# Patient Record
Sex: Female | Born: 1945
Health system: Southern US, Community
[De-identification: ages and names within clinical notes are randomized; demographics above are authoritative.]

## PROBLEM LIST (undated history)

## (undated) DIAGNOSIS — D509 Iron deficiency anemia, unspecified: Secondary | ICD-10-CM

## (undated) DIAGNOSIS — D649 Anemia, unspecified: Secondary | ICD-10-CM

## (undated) DIAGNOSIS — R002 Palpitations: Secondary | ICD-10-CM

## (undated) DIAGNOSIS — E119 Type 2 diabetes mellitus without complications: Secondary | ICD-10-CM

## (undated) DIAGNOSIS — E785 Hyperlipidemia, unspecified: Secondary | ICD-10-CM

## (undated) DIAGNOSIS — I6930 Unspecified sequelae of cerebral infarction: Secondary | ICD-10-CM

## (undated) DIAGNOSIS — J309 Allergic rhinitis, unspecified: Secondary | ICD-10-CM

## (undated) DIAGNOSIS — I639 Cerebral infarction, unspecified: Secondary | ICD-10-CM

## (undated) DIAGNOSIS — E7800 Pure hypercholesterolemia, unspecified: Secondary | ICD-10-CM

## (undated) DIAGNOSIS — I10 Essential (primary) hypertension: Secondary | ICD-10-CM

## (undated) DIAGNOSIS — K219 Gastro-esophageal reflux disease without esophagitis: Secondary | ICD-10-CM

## (undated) DIAGNOSIS — E1151 Type 2 diabetes mellitus with diabetic peripheral angiopathy without gangrene: Secondary | ICD-10-CM

## (undated) HISTORY — DX: Essential (primary) hypertension: I10

## (undated) HISTORY — DX: Gastro-esophageal reflux disease without esophagitis: K21.9

## (undated) HISTORY — DX: Type 2 diabetes mellitus with diabetic peripheral angiopathy without gangrene: E11.51

## (undated) HISTORY — DX: Anemia, unspecified: D64.9

## (undated) HISTORY — PX: ABDOMINAL HYSTERECTOMY: SHX81

## (undated) HISTORY — DX: Unspecified sequelae of cerebral infarction: I69.30

## (undated) HISTORY — DX: Iron deficiency anemia, unspecified: D50.9

## (undated) HISTORY — DX: Cerebral infarction, unspecified: I63.9

## (undated) HISTORY — DX: Pure hypercholesterolemia, unspecified: E78.00

## (undated) HISTORY — DX: Allergic rhinitis, unspecified: J30.9

---
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
## 1998-11-26 ENCOUNTER — Other Ambulatory Visit: Admission: RE | Admit: 1998-11-26 | Discharge: 1998-11-26 | Payer: Self-pay | Admitting: Gynecology

## 1999-07-17 ENCOUNTER — Ambulatory Visit (HOSPITAL_COMMUNITY): Admission: RE | Admit: 1999-07-17 | Discharge: 1999-07-17 | Payer: Self-pay | Admitting: Endocrinology

## 1999-07-17 ENCOUNTER — Encounter: Payer: Self-pay | Admitting: Endocrinology

## 1999-07-22 ENCOUNTER — Encounter: Admission: RE | Admit: 1999-07-22 | Discharge: 1999-10-20 | Payer: Self-pay | Admitting: Internal Medicine

## 1999-08-26 ENCOUNTER — Encounter: Payer: Self-pay | Admitting: *Deleted

## 1999-08-26 ENCOUNTER — Ambulatory Visit (HOSPITAL_COMMUNITY): Admission: RE | Admit: 1999-08-26 | Discharge: 1999-08-26 | Payer: Self-pay | Admitting: *Deleted

## 1999-11-12 ENCOUNTER — Ambulatory Visit (HOSPITAL_COMMUNITY): Admission: RE | Admit: 1999-11-12 | Discharge: 1999-11-12 | Payer: Self-pay | Admitting: Endocrinology

## 1999-11-12 ENCOUNTER — Encounter: Payer: Self-pay | Admitting: Endocrinology

## 2000-03-29 ENCOUNTER — Other Ambulatory Visit: Admission: RE | Admit: 2000-03-29 | Discharge: 2000-03-29 | Payer: Self-pay | Admitting: Gynecology

## 2000-11-09 ENCOUNTER — Encounter: Payer: Self-pay | Admitting: Endocrinology

## 2000-11-09 ENCOUNTER — Ambulatory Visit (HOSPITAL_COMMUNITY): Admission: RE | Admit: 2000-11-09 | Discharge: 2000-11-09 | Payer: Self-pay | Admitting: Endocrinology

## 2001-02-15 ENCOUNTER — Encounter: Payer: Self-pay | Admitting: Endocrinology

## 2001-02-15 ENCOUNTER — Encounter: Admission: RE | Admit: 2001-02-15 | Discharge: 2001-02-15 | Payer: Self-pay | Admitting: Endocrinology

## 2001-06-21 ENCOUNTER — Other Ambulatory Visit: Admission: RE | Admit: 2001-06-21 | Discharge: 2001-06-21 | Payer: Self-pay | Admitting: Gynecology

## 2002-06-28 ENCOUNTER — Other Ambulatory Visit: Admission: RE | Admit: 2002-06-28 | Discharge: 2002-06-28 | Payer: Self-pay | Admitting: Gynecology

## 2007-07-05 ENCOUNTER — Emergency Department (HOSPITAL_COMMUNITY): Admission: EM | Admit: 2007-07-05 | Discharge: 2007-07-05 | Payer: Self-pay | Admitting: Emergency Medicine

## 2008-07-24 ENCOUNTER — Inpatient Hospital Stay (HOSPITAL_COMMUNITY): Admission: EM | Admit: 2008-07-24 | Discharge: 2008-07-26 | Payer: Self-pay | Admitting: Emergency Medicine

## 2008-07-25 ENCOUNTER — Encounter (INDEPENDENT_AMBULATORY_CARE_PROVIDER_SITE_OTHER): Payer: Self-pay | Admitting: Internal Medicine

## 2009-01-01 ENCOUNTER — Other Ambulatory Visit: Admission: RE | Admit: 2009-01-01 | Discharge: 2009-01-01 | Payer: Self-pay | Admitting: Gynecology

## 2009-01-01 ENCOUNTER — Encounter: Payer: Self-pay | Admitting: Gynecology

## 2009-01-01 ENCOUNTER — Ambulatory Visit: Payer: Self-pay | Admitting: Gynecology

## 2010-04-04 ENCOUNTER — Encounter: Payer: Self-pay | Admitting: Endocrinology

## 2010-06-23 LAB — GLUCOSE, CAPILLARY
Glucose-Capillary: 109 mg/dL — ABNORMAL HIGH (ref 70–99)
Glucose-Capillary: 133 mg/dL — ABNORMAL HIGH (ref 70–99)
Glucose-Capillary: 139 mg/dL — ABNORMAL HIGH (ref 70–99)
Glucose-Capillary: 150 mg/dL — ABNORMAL HIGH (ref 70–99)
Glucose-Capillary: 98 mg/dL (ref 70–99)

## 2010-06-23 LAB — URINE MICROSCOPIC-ADD ON

## 2010-06-23 LAB — COMPREHENSIVE METABOLIC PANEL
ALT: 14 U/L (ref 0–35)
AST: 14 U/L (ref 0–37)
Alkaline Phosphatase: 70 U/L (ref 39–117)
CO2: 32 mEq/L (ref 19–32)
Calcium: 9.8 mg/dL (ref 8.4–10.5)
Chloride: 100 mEq/L (ref 96–112)
GFR calc Af Amer: >60 mL/min (ref >60)
GFR calc non Af Amer: >60 mL/min (ref >60)
Glucose, Bld: 129 mg/dL — ABNORMAL HIGH (ref 70–99)
Sodium: 137 mEq/L (ref 135–145)
Total Bilirubin: 0.4 mg/dL (ref 0.3–1.2)

## 2010-06-23 LAB — POCT CARDIAC MARKERS
CKMB, poc: 1.1 ng/mL (ref 1.0–8.0)
Myoglobin, poc: 98.9 ng/mL (ref 12–200)
Troponin i, poc: <0.05 ng/mL (ref 0.00–0.09)

## 2010-06-23 LAB — CBC
Hemoglobin: 11 g/dL — ABNORMAL LOW (ref 12.0–15.0)
Hemoglobin: 11.7 g/dL — ABNORMAL LOW (ref 12.0–15.0)
MCHC: 34 g/dL (ref 30.0–36.0)
MCHC: 35.1 g/dL (ref 30.0–36.0)
RBC: 4.04 MIL/uL (ref 3.87–5.11)
RBC: 4.16 MIL/uL (ref 3.87–5.11)
WBC: 7.2 10*3/uL (ref 4.0–10.5)
WBC: 7.8 10*3/uL (ref 4.0–10.5)

## 2010-06-23 LAB — CARDIAC PANEL(CRET KIN+CKTOT+MB+TROPI)
CK, MB: 0.8 ng/mL (ref 0.3–4.0)
Relative Index: INVALID (ref 0.0–2.5)
Relative Index: INVALID (ref 0.0–2.5)
Total CK: 42 U/L (ref 7–177)
Total CK: 65 U/L (ref 7–177)
Total CK: 83 U/L (ref 7–177)
Troponin I: 0.01 ng/mL (ref 0.00–0.06)

## 2010-06-23 LAB — BASIC METABOLIC PANEL
CO2: 32 mEq/L (ref 19–32)
Calcium: 9.8 mg/dL (ref 8.4–10.5)
GFR calc Af Amer: >60 mL/min (ref >60)
GFR calc non Af Amer: >60 mL/min (ref >60)
Sodium: 136 mEq/L (ref 135–145)

## 2010-06-23 LAB — URINALYSIS, ROUTINE W REFLEX MICROSCOPIC
Ketones, ur: NEGATIVE mg/dL
Leukocytes, UA: NEGATIVE
Protein, ur: NEGATIVE mg/dL
Urobilinogen, UA: 0.2 mg/dL (ref 0.0–1.0)

## 2010-07-28 NOTE — Discharge Summary (Signed)
NAME:  Carrie [name], Carrie [name]              ACCOUNT NO.:  [id number]   MEDICAL RECORD NO.:  [id number]          PATIENT TYPE:  INP   LOCATION:  1432                         FACILITY:  [facility]   PHYSICIAN:  Hind I Elsaid, MD      DATE OF BIRTH:  Nov 16, 1945   DATE OF ADMISSION:  07/24/2008  DATE OF DISCHARGE:  07/26/2008                               DISCHARGE SUMMARY   DISCHARGE DIAGNOSES:  1. Hypertensive urgency.  2. Noncompliance.  3. Diabetes mellitus type 2.  4. Hyperlipidemia.   DISCHARGE MEDICATIONS:  1. Diovan 160 mg daily.  2. Metformin 1000 mg p.o. b.i.d.  3. Amaril 1 mg p.o. daily.  4. Lotrel 5/20 mg once daily.  5. Hydrochlorothiazide 12.5 mg twice daily.  6. Aspirin 81 mg daily.  7. Zocor 20 mg p.o. q.h.s.   CONSULTATIONS:  None.   PROCEDURE:  Chest x-ray:  Tiny right pleural effusion.   HISTORY OF PRESENT ILLNESS:  This is a 65 year old African American  female who has a history of hypertension and noncompliance with her  medications.  The patient did not take her medications for 2 weeks and  she went to the Dentist's office and was found to have a blood pressure  of 205/110.  She felt some dizziness.  Patient came to the hospital and  was found to have elevated blood pressure of 203/125.  The patient  apparently had not taken her medications for 2 weeks.  The patient is  noncompliant with Dr. Marylen Ponto office.   HOSPITAL COURSE:  1. Hypertensive urgency.  Patient kept on a monitor and was started      back on her medications after calling Dr. Juleen China.  The patient      apparently did not see Dr. Juleen China for 1 year secondary to an      insurance problem.  Adjustment of her blood pressure medications      done during hospital stay and the patient will be discharged with      above medications.  Patient was advised to follow up with Dr. Juleen China      next week.  An appointment was made to see him on Tuesday, Jul 30, 2008.  Education was done during hospital stay.  2.  Diabetes mellitus.  This remains under good control.  Hemoglobin      A1C was found to be 7.4.  The patient      needs to follow up closely.  3. Hyperlipidemia.  To continue with Zocor and follow with Dr. Juleen China      next week.  4. Dizziness, completely resolved, which was felt secondary to      hypertensive urgency.      Hind Bosie Helper, MD  Electronically Signed     HIE/MEDQ  D:  07/26/2008  T:  07/26/2008  Job:  [id number]

## 2010-07-28 NOTE — H&P (Signed)
NAME:  Carrie [name], Carrie [name]              ACCOUNT NO.:  [id number]   MEDICAL RECORD NO.:  [id number]          PATIENT TYPE:  INP   LOCATION:  0101                         FACILITY:  [facility]   PHYSICIAN:  Altha Harm, MDDATE OF BIRTH:  [birth year]   DATE OF ADMISSION:  07/24/2008  DATE OF DISCHARGE:                              HISTORY & PHYSICAL   CHIEF COMPLAINT:  High blood pressure and dizziness.   HISTORY OF PRESENT ILLNESS:  This is a 65 year old female with a history  of hypertension who has been not adherent to medications for the last 2  weeks.  The patient states that she went to dentist's office for  procedure this morning and was found to have a blood pressure 205/110.  She states that upon leaving the dentist's office and going back to  work, she noticed she was having dizziness.  She went into [facility] and  checked her blood pressures and found it to be even higher than  originally measured.  The patient denies any chest pain.  She denies any  nausea, vomiting or diarrhea.  She denies any fever or chills.  She does  admit to dizziness.  She states that she felt that the room was  spinning.  However, she is unable to qualify it any further than that.  The patient has had no cough, no dysuria, no frequency and urgency.  No  diarrhea.  No constipation.  No weight loss, weight gain.  No hot or  cold intolerance.   PAST MEDICAL HISTORY:  1. Significant for hypertension.  2. Diabetes type 2.   FAMILY HISTORY:  Significant for diabetes type 2 and a CVA in her  father.   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  She denies any tobacco, alcohol or drug use.   CURRENT MEDICATIONS:  1. Include metformin 1000 mg p.o. daily.  2. Hydrochlorothiazide 25 mg p.o. daily.  3. The patient is on Toprol.  4. Diovan.  She does not know the doses, but she has not taken them in      the last 2 weeks.   ALLERGIES:  PENICILLIN which causes  hives.   PRIMARY CARE PHYSICIAN:  Dr. Juleen China.   REVIEW OF SYSTEMS:  14 systems reviewed, all systems are negative except  for noted in the HPI.   STUDIES IN THE EMERGENCY ROOM:  Shows the following.  The patient has a  hemogram showed white blood cell count of 7.8, hemoglobin 11.7,  hematocrit of 33.4, platelet count of 411,000.  Sodium 136, potassium  4.0, chloride 98, bicarb 32, BUN 11, creatinine 0.47.  Urinalysis is  negative for any elements consistent with a UTI.   PHYSICAL EXAMINATION:  CONSTITUTIONAL:  The patient is laying in bed in  no acute distress.  VITAL SIGNS:  Are as follows, blood pressure 168/93, heart rate 60,  respiratory rate 15, O2 sats 98% on room air.  HEENT:  Examination the patient is normocephalic, atraumatic.  Pupils  equally round and reactive to light and accommodation.  Extraocular  movements are intact.  Oropharynx  is moist with no exudate, erythema or  lesions are noted.  NECK:  Examination trachea is midline.  No masses, no thyromegaly or  JVD.  No carotid bruit.  CARDIOVASCULAR:  She has got a normal S1, S2 no murmurs, rubs or gallops  noted.  PMI is nondisplaced.  No heaves or thrills on palpation.  ABDOMEN:  Obese, soft, nontender, nondistended.  No masses, no  hepatosplenomegaly noted.  LYMPHATICS:  She has got no cervical, axillary inguinal lymphadenopathy  noted.  NEUROLOGICAL:  She has got no focal neurological deficits.  Cranial  nerves 2-12 are grossly intact.  DTRs are 2+ bilaterally upper and lower  extremities.  PSYCHIATRIC:  The patient is alert and oriented x3, good insight and  cognition.  Good recent and remote recall.  MUSCULOSKELETAL:  No warmth, swelling or erythema around the joints.   ASSESSMENT/PLAN:  1. This is a patient who presents with hypertensive urgency, likely      secondary to noncompliance with medications.  The patient has had      several antihypertensive medications here in the emergency room      that have  lowered her blood pressure.  I will put her back on her      medications as far as they are known.  Currently requesting list of      medications from Dr. Marylen Ponto office and her medications should be      adjusted based upon response to initial medications and her usual      medications at the office.  2. Diabetes type 2.  I will place the patient back on her usual      medications.  Check a blood sugars a.c. and h.s. and of correction      dose insulin as necessary.  3. Dizziness.  This is more likely secondary to her markedly elevated      blood pressures.  Dizziness is now resolved.  We will observe the      patient.  If she continues to have dizziness then workup pursuant      to the dizziness will ensue.      Altha Harm, MD  Electronically Signed     MAM/MEDQ  D:  07/24/2008  T:  07/24/2008  Job:  [id number]

## 2010-07-31 NOTE — H&P (Signed)
[facility]  Patient:    Carrie [name], Carrie [name]                       MRN: [id number] Adm. Date:  07/22/99 Attending:  Maisie Fus B. Samuella Cota, M.D.                         History and Physical  [facility] VISIT  CHIEF COMPLAINT:  Deformed toenails.  HISTORY OF PRESENT ILLNESS:  This 65 year old black female who is a patient of Dr. Adela Lank is seen because of her toenails. She has had trouble with her nails in the past and has had trouble trimming them.  She is a diabetic, type 2.  PAST MEDICAL HISTORY AND REVIEW OF SYSTEMS:  ALLERGIES:  PENICILLIN produces hives and edema.  OPERATIONS:  Total abdominal hysterectomy, C-section in 1974, multiple D&Cs.  SERIOUS ILLNESSES/INJURIES:  Diabetes mellitus, type 2, diagnosed in 1999. She has hypertension, GERD, and elevated cholesterol.  MEDICATIONS: 1. Glucophage XR 500 mg four tablets a day. 2. Corzide 40/5 mg one-half tablet a day. 3. Baycol 0.4 mg a day. 4. Glucotrol XL 5 mg a day. 5. Tiazac 360 mg a day. 6. Zestoretic 20/25 mg daily. 7. Prilosec 20 mg daily. 8. Protonix 40 mg a day to begin when she uses all her Prilosec.  EXAMINATION LIMITED TO THE FEET:  The patient has good pulses with no skin problems.  She has hypertrophied nails with the left big toenail being separated from the nailbed.  All nails were trimmed and the toenail of the left big toe was essentially removed back to almost its origin.  The patient was offered the chance for [place] to trim her nails every three months, but she thinks she will be able to do it herself.  We will see her back in the clinic on a p.r.n. basis. DD:  07/22/99 TD:  07/22/99 Job: [id number]

## 2010-08-04 ENCOUNTER — Other Ambulatory Visit (HOSPITAL_COMMUNITY): Payer: Self-pay | Admitting: Endocrinology

## 2010-08-04 DIAGNOSIS — R1011 Right upper quadrant pain: Secondary | ICD-10-CM

## 2010-08-06 ENCOUNTER — Other Ambulatory Visit (HOSPITAL_COMMUNITY): Payer: Self-pay

## 2011-02-15 ENCOUNTER — Encounter: Payer: Self-pay | Admitting: Gynecology

## 2011-02-16 ENCOUNTER — Other Ambulatory Visit: Payer: Self-pay | Admitting: *Deleted

## 2011-02-16 DIAGNOSIS — N6320 Unspecified lump in the left breast, unspecified quadrant: Secondary | ICD-10-CM

## 2011-02-18 ENCOUNTER — Other Ambulatory Visit: Payer: Self-pay | Admitting: Gynecology

## 2011-02-18 DIAGNOSIS — N6320 Unspecified lump in the left breast, unspecified quadrant: Secondary | ICD-10-CM

## 2011-02-19 ENCOUNTER — Other Ambulatory Visit: Payer: Self-pay | Admitting: *Deleted

## 2011-02-19 DIAGNOSIS — N6320 Unspecified lump in the left breast, unspecified quadrant: Secondary | ICD-10-CM

## 2011-02-23 ENCOUNTER — Ambulatory Visit (INDEPENDENT_AMBULATORY_CARE_PROVIDER_SITE_OTHER): Payer: Self-pay | Admitting: *Deleted

## 2011-02-23 VITALS — BP 171/76 | HR 78 | Temp 97.7°F | Resp 18 | Ht 61.0 in | Wt 157.2 lb

## 2011-02-23 DIAGNOSIS — N6320 Unspecified lump in the left breast, unspecified quadrant: Secondary | ICD-10-CM

## 2011-02-23 DIAGNOSIS — N63 Unspecified lump in unspecified breast: Secondary | ICD-10-CM

## 2011-02-23 NOTE — Patient Instructions (Signed)
Taught patient how to perform BSE and gave educational materials to take home. Patient did not need a Pap smear today due to last Pap smear was 01/02/09 and has had a hysterectomy. Informed patient she does not need any further Pap smears due to having a history of hysterectomy  for endometriosis and no history of abnormal Pap smears. Patient is scheduled for a left breast biopsy Thursday, February 25, 2011 at 1015. Patient aware of appointment and will be there. Let patient know will follow up with her within the next couple weeks with results. Patient verbalized understanding.

## 2011-02-23 NOTE — Progress Notes (Signed)
Complaints of left breast lump.  Pap Smear:    Pap smear not performed today. Patients last Pap smear was 01/01/09 and was normal. Per patient she has no history of abnormal Pap smears. Patient has a history of a hysterectomy related to endometriosis. No further Pap smears are needed related to patient having a hysterectomy for benign reasons and no history of abnormal Pap smears. Pap smear result above is in EPIC.  Physical exam: Breasts Breasts symmetrical. No skin abnormalities bilateral breasts. No nipple retraction bilateral breasts. No nipple discharge bilateral breasts. No lymphadenopathy. No lumps palpated right breast. Palpated a small lump in the left breast around 2 o'clock. No complaints of tenderness on palpation. Patient scheduled for a follow up breast biopsy at [facility] on Thursday, February 25, 2011 at [place].         Pelvic/Bimanual No Pap smear completed today since last Pap smear was 01/01/09 and normal. Pap smear not indicated per BCCCP guidelines.

## 2011-02-25 ENCOUNTER — Other Ambulatory Visit: Payer: Self-pay

## 2011-02-26 ENCOUNTER — Encounter: Payer: Self-pay | Admitting: Gynecology

## 2011-02-26 ENCOUNTER — Other Ambulatory Visit: Payer: Self-pay | Admitting: Gynecology

## 2011-02-26 DIAGNOSIS — N6320 Unspecified lump in the left breast, unspecified quadrant: Secondary | ICD-10-CM

## 2011-03-04 ENCOUNTER — Telehealth: Payer: Self-pay | Admitting: *Deleted

## 2011-03-04 NOTE — Telephone Encounter (Signed)
Results for patient's breast biopsy were called to patient on 02/26/11 by [facility]. Patient is aware of needed follow up and will call to schedule her 6 month follow up appointment.

## 2011-03-05 ENCOUNTER — Encounter: Payer: Self-pay | Admitting: Gynecology

## 2011-03-31 ENCOUNTER — Other Ambulatory Visit: Payer: Self-pay | Admitting: Gynecology

## 2011-03-31 ENCOUNTER — Other Ambulatory Visit: Payer: Self-pay | Admitting: *Deleted

## 2011-03-31 DIAGNOSIS — N6489 Other specified disorders of breast: Secondary | ICD-10-CM

## 2011-06-09 ENCOUNTER — Encounter (HOSPITAL_COMMUNITY): Payer: Self-pay | Admitting: Emergency Medicine

## 2011-06-09 ENCOUNTER — Emergency Department (HOSPITAL_COMMUNITY)
Admission: EM | Admit: 2011-06-09 | Discharge: 2011-06-09 | Disposition: A | Discharge status: Home / Self Care | Payer: Self-pay | Attending: Emergency Medicine | Admitting: Emergency Medicine

## 2011-06-09 DIAGNOSIS — Z90710 Acquired absence of both cervix and uterus: Secondary | ICD-10-CM | POA: Insufficient documentation

## 2011-06-09 DIAGNOSIS — E119 Type 2 diabetes mellitus without complications: Secondary | ICD-10-CM | POA: Insufficient documentation

## 2011-06-09 DIAGNOSIS — Z79899 Other long term (current) drug therapy: Secondary | ICD-10-CM | POA: Insufficient documentation

## 2011-06-09 DIAGNOSIS — I10 Essential (primary) hypertension: Secondary | ICD-10-CM | POA: Insufficient documentation

## 2011-06-09 DIAGNOSIS — Z880 Allergy status to penicillin: Secondary | ICD-10-CM | POA: Insufficient documentation

## 2011-06-09 LAB — CBC
HCT: 32.1 % — ABNORMAL LOW (ref 36.0–46.0)
Hemoglobin: 10.8 g/dL — ABNORMAL LOW (ref 12.0–15.0)
MCH: 27.2 pg (ref 26.0–34.0)
MCV: 80.9 fL (ref 78.0–100.0)
RBC: 3.97 MIL/uL (ref 3.87–5.11)
WBC: 7.3 10*3/uL (ref 4.0–10.5)

## 2011-06-09 LAB — POCT I-STAT, CHEM 8
BUN: 19 mg/dL (ref 6–23)
Chloride: 101 mEq/L (ref 96–112)
Creatinine, Ser: 0.8 mg/dL (ref 0.50–1.10)
Sodium: 138 mEq/L (ref 135–145)

## 2011-06-09 MED ORDER — METOPROLOL SUCCINATE ER 50 MG PO TB24
50.0000 mg | ORAL_TABLET | ORAL | Status: AC
  Administered 2011-06-09: 50 mg via ORAL
  Filled 2011-06-09: qty 1

## 2011-06-09 NOTE — Discharge Instructions (Signed)
Arterial Hypertension Arterial hypertension (high blood pressure) is a condition of elevated pressure in your blood vessels. Hypertension over a long period of time is a risk factor for strokes, heart attacks, and heart failure. It is also the leading cause of kidney (renal) failure.  CAUSES   In Adults -- Over 90% of all hypertension has no known cause. This is called essential or primary hypertension. In the other 10% of people with hypertension, the increase in blood pressure is caused by another disorder. This is called secondary hypertension. Important causes of secondary hypertension are:   Heavy alcohol use.   Obstructive sleep apnea.   Hyperaldosterosim (Conn's syndrome).   Steroid use.   Chronic kidney failure.   Hyperparathyroidism.   Medications.   Renal artery stenosis.   Pheochromocytoma.   Cushing's disease.   Coarctation of the aorta.   Scleroderma renal crisis.   Licorice (in excessive amounts).   Drugs (cocaine, methamphetamine).  Your caregiver can explain any items above that apply to you.  In Children -- Secondary hypertension is more common and should always be considered.   Pregnancy -- Few women of childbearing age have high blood pressure. However, up to 10% of them develop hypertension of pregnancy. Generally, this will not harm the woman. It may be a sign of 3 complications of pregnancy: preeclampsia, HELLP syndrome, and eclampsia. Follow up and control with medication is necessary.  SYMPTOMS   This condition normally does not produce any noticeable symptoms. It is usually found during a routine exam.   Malignant hypertension is a late problem of high blood pressure. It may have the following symptoms:   Headaches.   Blurred vision.   End-organ damage (this means your kidneys, heart, lungs, and other organs are being damaged).   Stressful situations can increase the blood pressure. If a person with normal blood pressure has their blood  pressure go up while being seen by their caregiver, this is often termed "white coat hypertension." Its importance is not known. It may be related with eventually developing hypertension or complications of hypertension.   Hypertension is often confused with mental tension, stress, and anxiety.  DIAGNOSIS  The diagnosis is made by 3 separate blood pressure measurements. They are taken at least 1 week apart from each other. If there is organ damage from hypertension, the diagnosis may be made without repeat measurements. Hypertension is usually identified by having blood pressure readings:  Above 140/90 mmHg measured in both arms, at 3 separate times, over a couple weeks.   Over 130/80 mmHg should be considered a risk factor and may require treatment in patients with diabetes.  Blood pressure readings over 120/80 mmHg are called "pre-hypertension" even in non-diabetic patients. To get a true blood pressure measurement, use the following guidelines. Be aware of the factors that can alter blood pressure readings.  Take measurements at least 1 hour after caffeine.   Take measurements 30 minutes after smoking and without any stress. This is another reason to quit smoking - it raises your blood pressure.   Use a proper cuff size. Ask your caregiver if you are not sure about your cuff size.   Most home blood pressure cuffs are automatic. They will measure systolic and diastolic pressures. The systolic pressure is the pressure reading at the start of sounds. Diastolic pressure is the pressure at which the sounds disappear. If you are elderly, measure pressures in multiple postures. Try sitting, lying or standing.   Sit at rest for a minimum of   5 minutes before taking measurements.   You should not be on any medications like decongestants. These are found in many cold medications.   Record your blood pressure readings and review them with your caregiver.  If you have hypertension:  Your caregiver  may do tests to be sure you do not have secondary hypertension (see "causes" above).   Your caregiver may also look for signs of metabolic syndrome. This is also called Syndrome X or Insulin Resistance Syndrome. You may have this syndrome if you have type 2 diabetes, abdominal obesity, and abnormal blood lipids in addition to hypertension.   Your caregiver will take your medical and family history and perform a physical exam.   Diagnostic tests may include blood tests (for glucose, cholesterol, potassium, and kidney function), a urinalysis, or an EKG. Other tests may also be necessary depending on your condition.  PREVENTION  There are important lifestyle issues that you can adopt to reduce your chance of developing hypertension:  Maintain a normal weight.   Limit the amount of salt (sodium) in your diet.   Exercise often.   Limit alcohol intake.   Get enough potassium in your diet. Discuss specific advice with your caregiver.   Follow a DASH diet (dietary approaches to stop hypertension). This diet is rich in fruits, vegetables, and low-fat dairy products, and avoids certain fats.  PROGNOSIS  Essential hypertension cannot be cured. Lifestyle changes and medical treatment can lower blood pressure and reduce complications. The prognosis of secondary hypertension depends on the underlying cause. Many people whose hypertension is controlled with medicine or lifestyle changes can live a normal, healthy life.  RISKS AND COMPLICATIONS  While high blood pressure alone is not an illness, it often requires treatment due to its short- and long-term effects on many organs. Hypertension increases your risk for:  CVAs or strokes (cerebrovascular accident).   Heart failure due to chronically high blood pressure (hypertensive cardiomyopathy).   Heart attack (myocardial infarction).   Damage to the retina (hypertensive retinopathy).   Kidney failure (hypertensive nephropathy).  Your caregiver can  explain list items above that apply to you. Treatment of hypertension can significantly reduce the risk of complications. TREATMENT   For overweight patients, weight loss and regular exercise are recommended. Physical fitness lowers blood pressure.   Mild hypertension is usually treated with diet and exercise. A diet rich in fruits and vegetables, fat-free dairy products, and foods low in fat and salt (sodium) can help lower blood pressure. Decreasing salt intake decreases blood pressure in a 1/3 of people.   Stop smoking if you are a smoker.  The steps above are highly effective in reducing blood pressure. While these actions are easy to suggest, they are difficult to achieve. Most patients with moderate or severe hypertension end up requiring medications to bring their blood pressure down to a normal level. There are several classes of medications for treatment. Blood pressure pills (antihypertensives) will lower blood pressure by their different actions. Lowering the blood pressure by 10 mmHg may decrease the risk of complications by as much as 25%. The goal of treatment is effective blood pressure control. This will reduce your risk for complications. Your caregiver will help you determine the best treatment for you according to your lifestyle. What is excellent treatment for one person, may not be for you. HOME CARE INSTRUCTIONS   Do not smoke.   Follow the lifestyle changes outlined in the "Prevention" section.   If you are on medications, follow the directions   carefully. Blood pressure medications must be taken as prescribed. Skipping doses reduces their benefit. It also puts you at risk for problems.   Follow up with your caregiver, as directed.   If you are asked to monitor your blood pressure at home, follow the guidelines in the "Diagnosis" section above.  SEEK MEDICAL CARE IF:   You think you are having medication side effects.   You have recurrent headaches or lightheadedness.     You have swelling in your ankles.   You have trouble with your vision.  SEEK IMMEDIATE MEDICAL CARE IF:   You have sudden onset of chest pain or pressure, difficulty breathing, or other symptoms of a heart attack.   You have a severe headache.   You have symptoms of a stroke (such as sudden weakness, difficulty speaking, difficulty walking).  MAKE SURE YOU:   Understand these instructions.   Will watch your condition.   Will get help right away if you are not doing well or get worse.  Document Released: 03/01/2005 Document Revised: 02/18/2011 Document Reviewed: 09/29/2006 [employer] Patient Information 2012 [employer]. 

## 2011-06-09 NOTE — ED Notes (Signed)
PT. REPORTS ELEVATED BLOOD PRESSURE THIS EVENING AT HOME 220/110 , DENIES PAIN OR DISCOMFORT , RESPIRATIONS UNLABORED .

## 2011-06-09 NOTE — ED Provider Notes (Signed)
History     CSN: [id number]  Arrival date & time 06/09/11  0138   First MD Initiated Contact with Patient 06/09/11 0231      Chief Complaint  Patient presents with  . Hypertension    (Consider location/radiation/quality/duration/timing/severity/associated sxs/prior treatment) Patient is a 66 y.o. female presenting with hypertension. The history is provided by the patient.  Hypertension This is a recurrent problem. The current episode started 2 days ago. The problem has not changed since onset.Pertinent negatives include no chest pain, no abdominal pain, no headaches and no shortness of breath. The symptoms are aggravated by nothing. The symptoms are relieved by nothing. She has tried nothing for the symptoms.   the last few days has had elevated blood pressure. She took it 3 times a day. She takes multiple medications for blood pressure at home. She has no symptoms with her elevated blood pressure. No chest pain. No shortness breath. No abdominal pain. No headache. No weakness. No numbness. She is very anxious and admits to increased stress recently regarding her declining health of her mother. Moderate in severity. No known aggravating or alleviating factors.   Past Medical History  Diagnosis Date  . Anemia   . Diabetes mellitus   . Hypertension     Past Surgical History  Procedure Date  . Abdominal hysterectomy     1993  . Cesarean section     Family History  Problem Relation Age of Onset  . Hypertension Father   . Diabetes Mother   . Hypertension Mother   . Diabetes Brother   . Diabetes Sister     History  Substance Use Topics  . Smoking status: Never Smoker   . Smokeless tobacco: Not on file  . Alcohol Use: No    OB History    Grav Para Term Preterm Abortions TAB SAB Ect Mult Living   1         1      Review of Systems  Constitutional: Negative for fever and chills.  HENT: Negative for neck pain and neck stiffness.   Eyes: Negative for pain.    Respiratory: Negative for shortness of breath.   Cardiovascular: Negative for chest pain.  Gastrointestinal: Negative for abdominal pain.  Genitourinary: Negative for dysuria.  Musculoskeletal: Negative for back pain.  Skin: Negative for rash.  Neurological: Negative for headaches.  All other systems reviewed and are negative.    Allergies  Penicillins  Home Medications   Current Outpatient Rx  Name Route Sig Dispense Refill  . ESOMEPRAZOLE MAGNESIUM 40 MG PO CPDR Oral Take 40 mg by mouth daily before breakfast.      . GLIMEPIRIDE 1 MG PO TABS Oral Take by mouth daily before breakfast.      . LOSARTAN POTASSIUM 25 MG PO TABS Oral Take 25 mg by mouth 2 (two) times daily.    Marland Kitchen METFORMIN HCL 1000 MG PO TABS Oral Take 1,000 mg by mouth 2 (two) times daily with a meal.      . METOPROLOL SUCCINATE ER 100 MG PO TB24 Oral Take 100 mg by mouth daily.      Marland Kitchen SIMVASTATIN 20 MG PO TABS Oral Take 20 mg by mouth at bedtime.        BP 178/76  Pulse 62  Temp(Src) 97.4 F (36.3 C) (Oral)  Resp 18  SpO2 100%  Physical Exam  Constitutional: She is oriented to person, place, and time. She appears well-developed and well-nourished.  HENT:  Head: Normocephalic and  atraumatic.  Eyes: Conjunctivae and EOM are normal. Pupils are equal, round, and reactive to light.  Neck: Trachea normal. Neck supple. No thyromegaly present.  Cardiovascular: Normal rate, regular rhythm, S1 normal, S2 normal and normal pulses.     No systolic murmur is present   No diastolic murmur is present  Pulses:      Radial pulses are 2+ on the right side, and 2+ on the left side.  Pulmonary/Chest: Effort normal and breath sounds normal. She has no wheezes. She has no rhonchi. She has no rales. She exhibits no tenderness.  Abdominal: Soft. Normal appearance and bowel sounds are normal. There is no tenderness. There is no CVA tenderness and negative Murphy's sign.  Musculoskeletal:       BLE:s Calves nontender, no cords  or erythema, negative Homans sign  Neurological: She is alert and oriented to person, place, and time. She has normal strength. No cranial nerve deficit or sensory deficit. GCS eye subscore is 4. GCS verbal subscore is 5. GCS motor subscore is 6.  Skin: Skin is warm and dry. No rash noted. She is not diaphoretic.  Psychiatric: Her speech is normal.       Mild anxiety regarding her symptoms    ED Course  Procedures (including critical care time)  Labs Reviewed  CBC - Abnormal; Notable for the following:    Hemoglobin 10.8 (*)    HCT 32.1 (*)    All other components within normal limits  POCT I-STAT, CHEM 8 - Abnormal; Notable for the following:    Glucose, Bld 147 (*)    Hemoglobin 11.2 (*)    HCT 33.0 (*)    All other components within normal limits   Toprol by mouth with improving blood pressure.   MDM   Hypertension on medications, improved with extra dose of Toprol in the ED. Plan discharge home with followup primary care physician for further evaluation of blood pressure and further evaluation of anxiety and symptoms. Patient stable for discharge at this time without indication for further ED workup at this time        Sunnie Nielsen, MD 06/09/11 [phone]

## 2011-08-25 ENCOUNTER — Telehealth: Payer: Self-pay | Admitting: *Deleted

## 2011-08-25 NOTE — Telephone Encounter (Signed)
Telephoned patient at home # and spoke with patient about scheduling 6 month follow up appointment for breast mammogram and ultrasound for June. Patient stated has appointed scheduled June 18th at [facility].

## 2011-08-31 ENCOUNTER — Other Ambulatory Visit: Payer: Self-pay | Admitting: *Deleted

## 2011-08-31 DIAGNOSIS — IMO0001 Reserved for inherently not codable concepts without codable children: Secondary | ICD-10-CM

## 2011-09-07 ENCOUNTER — Telehealth: Payer: Self-pay | Admitting: *Deleted

## 2011-09-07 NOTE — Telephone Encounter (Signed)
Patient's diagnostic mammogram was benign. The results were discussed with patient at visit with [facility].

## 2012-01-27 ENCOUNTER — Telehealth (HOSPITAL_COMMUNITY): Payer: Self-pay | Admitting: *Deleted

## 2012-01-27 NOTE — Telephone Encounter (Signed)
Telephoned home # no answer.

## 2012-02-17 ENCOUNTER — Other Ambulatory Visit: Payer: Self-pay | Admitting: *Deleted

## 2012-02-17 DIAGNOSIS — R928 Other abnormal and inconclusive findings on diagnostic imaging of breast: Secondary | ICD-10-CM

## 2012-02-21 ENCOUNTER — Encounter (HOSPITAL_COMMUNITY): Payer: Self-pay | Admitting: *Deleted

## 2012-02-23 ENCOUNTER — Other Ambulatory Visit: Payer: Self-pay | Admitting: Gynecology

## 2012-02-23 DIAGNOSIS — R928 Other abnormal and inconclusive findings on diagnostic imaging of breast: Secondary | ICD-10-CM

## 2012-02-29 ENCOUNTER — Encounter (HOSPITAL_COMMUNITY): Payer: Self-pay

## 2012-02-29 ENCOUNTER — Ambulatory Visit (HOSPITAL_COMMUNITY)
Admission: RE | Admit: 2012-02-29 | Discharge: 2012-02-29 | Disposition: A | Discharge status: Home / Self Care | Payer: Self-pay | Source: Ambulatory Visit | Attending: Obstetrics and Gynecology | Admitting: Obstetrics and Gynecology

## 2012-02-29 VITALS — BP 118/76 | Temp 98.1°F | Ht 61.0 in | Wt 157.2 lb

## 2012-02-29 DIAGNOSIS — Z1239 Encounter for other screening for malignant neoplasm of breast: Secondary | ICD-10-CM

## 2012-02-29 NOTE — Patient Instructions (Signed)
Discussed with patient how to perform BSE and reinforced information given at last BCCCP appointment. Patient did not need a Pap smear today due to patient has a history or a hysterectomy for benign reasons. Let patient know she will not need any further Pap smears. Patient had her last mammogram completed at [facility] 02-18-12 that was normal. Let her know her next mammogram will be due 02-17-13. Told patient when she turns 67 she will no longer be eligible for BCCCP unless is not eligible for Medicare. Patient verbalized understanding.

## 2012-02-29 NOTE — Progress Notes (Signed)
No complaints today. Screening mammogram completed at [facility] 02/18/2012.  Pap Smear:    Pap smear not performed today. Patients last Pap smear was 01/01/2009 at [facility] and normal. Per patient she has no history of abnormal Pap smears. Patient has a history of a hysterectomy for endometriosis. Let patient know that she would not need any further Pap smears. Pap smear result above is in EPIC.  Physical exam: Breasts Breasts symmetrical. No skin abnormalities bilateral breasts. No nipple retraction bilateral breasts. No nipple discharge bilateral breasts. No lymphadenopathy. No lumps palpated bilateral breasts. No complaints of pain or tenderness on exam. Next screening mammogram due 02/17/2013.          Pelvic/Bimanual No Pap smear completed today since patient has a history of a hysterectomy for benign reasons. Pap smear not indicated per BCCCP guidelines.

## 2012-08-13 ENCOUNTER — Other Ambulatory Visit: Payer: Self-pay | Admitting: Internal Medicine

## 2013-01-19 ENCOUNTER — Encounter (HOSPITAL_COMMUNITY): Payer: Self-pay

## 2013-02-21 ENCOUNTER — Encounter: Payer: Self-pay | Admitting: Gynecology

## 2014-01-14 ENCOUNTER — Encounter (HOSPITAL_COMMUNITY): Payer: Self-pay

## 2014-07-01 DIAGNOSIS — E789 Disorder of lipoprotein metabolism, unspecified: Secondary | ICD-10-CM | POA: Diagnosis not present

## 2014-07-01 DIAGNOSIS — I10 Essential (primary) hypertension: Secondary | ICD-10-CM | POA: Diagnosis not present

## 2014-07-01 DIAGNOSIS — E119 Type 2 diabetes mellitus without complications: Secondary | ICD-10-CM | POA: Diagnosis not present

## 2014-07-04 DIAGNOSIS — E789 Disorder of lipoprotein metabolism, unspecified: Secondary | ICD-10-CM | POA: Diagnosis not present

## 2014-07-04 DIAGNOSIS — I10 Essential (primary) hypertension: Secondary | ICD-10-CM | POA: Diagnosis not present

## 2014-07-04 DIAGNOSIS — E118 Type 2 diabetes mellitus with unspecified complications: Secondary | ICD-10-CM | POA: Diagnosis not present

## 2014-07-04 DIAGNOSIS — D649 Anemia, unspecified: Secondary | ICD-10-CM | POA: Diagnosis not present

## 2014-12-30 DIAGNOSIS — E789 Disorder of lipoprotein metabolism, unspecified: Secondary | ICD-10-CM | POA: Diagnosis not present

## 2014-12-30 DIAGNOSIS — Z0000 Encounter for general adult medical examination without abnormal findings: Secondary | ICD-10-CM | POA: Diagnosis not present

## 2014-12-30 DIAGNOSIS — I10 Essential (primary) hypertension: Secondary | ICD-10-CM | POA: Diagnosis not present

## 2014-12-30 DIAGNOSIS — E119 Type 2 diabetes mellitus without complications: Secondary | ICD-10-CM | POA: Diagnosis not present

## 2015-01-06 DIAGNOSIS — I10 Essential (primary) hypertension: Secondary | ICD-10-CM | POA: Diagnosis not present

## 2015-01-06 DIAGNOSIS — E118 Type 2 diabetes mellitus with unspecified complications: Secondary | ICD-10-CM | POA: Diagnosis not present

## 2015-01-28 DIAGNOSIS — D649 Anemia, unspecified: Secondary | ICD-10-CM | POA: Diagnosis not present

## 2015-01-28 DIAGNOSIS — E039 Hypothyroidism, unspecified: Secondary | ICD-10-CM | POA: Diagnosis not present

## 2015-01-28 DIAGNOSIS — I10 Essential (primary) hypertension: Secondary | ICD-10-CM | POA: Diagnosis not present

## 2015-01-28 DIAGNOSIS — E118 Type 2 diabetes mellitus with unspecified complications: Secondary | ICD-10-CM | POA: Diagnosis not present

## 2015-02-24 DIAGNOSIS — Z1231 Encounter for screening mammogram for malignant neoplasm of breast: Secondary | ICD-10-CM | POA: Diagnosis not present

## 2015-02-25 ENCOUNTER — Encounter: Payer: Self-pay | Admitting: Gynecology

## 2015-03-03 DIAGNOSIS — E119 Type 2 diabetes mellitus without complications: Secondary | ICD-10-CM | POA: Diagnosis not present

## 2015-03-03 DIAGNOSIS — H25013 Cortical age-related cataract, bilateral: Secondary | ICD-10-CM | POA: Diagnosis not present

## 2015-03-03 DIAGNOSIS — H52203 Unspecified astigmatism, bilateral: Secondary | ICD-10-CM | POA: Diagnosis not present

## 2015-03-03 DIAGNOSIS — H524 Presbyopia: Secondary | ICD-10-CM | POA: Diagnosis not present

## 2015-03-03 DIAGNOSIS — H2513 Age-related nuclear cataract, bilateral: Secondary | ICD-10-CM | POA: Diagnosis not present

## 2015-06-30 DIAGNOSIS — E789 Disorder of lipoprotein metabolism, unspecified: Secondary | ICD-10-CM | POA: Diagnosis not present

## 2015-06-30 DIAGNOSIS — E119 Type 2 diabetes mellitus without complications: Secondary | ICD-10-CM | POA: Diagnosis not present

## 2015-06-30 DIAGNOSIS — I10 Essential (primary) hypertension: Secondary | ICD-10-CM | POA: Diagnosis not present

## 2015-06-30 DIAGNOSIS — E118 Type 2 diabetes mellitus with unspecified complications: Secondary | ICD-10-CM | POA: Diagnosis not present

## 2015-07-07 DIAGNOSIS — E118 Type 2 diabetes mellitus with unspecified complications: Secondary | ICD-10-CM | POA: Diagnosis not present

## 2015-07-07 DIAGNOSIS — D649 Anemia, unspecified: Secondary | ICD-10-CM | POA: Diagnosis not present

## 2015-07-22 DIAGNOSIS — I10 Essential (primary) hypertension: Secondary | ICD-10-CM | POA: Diagnosis not present

## 2015-07-22 DIAGNOSIS — E119 Type 2 diabetes mellitus without complications: Secondary | ICD-10-CM | POA: Diagnosis not present

## 2015-07-29 DIAGNOSIS — M549 Dorsalgia, unspecified: Secondary | ICD-10-CM | POA: Diagnosis not present

## 2015-07-29 DIAGNOSIS — I10 Essential (primary) hypertension: Secondary | ICD-10-CM | POA: Diagnosis not present

## 2015-07-29 DIAGNOSIS — E118 Type 2 diabetes mellitus with unspecified complications: Secondary | ICD-10-CM | POA: Diagnosis not present

## 2016-01-22 DIAGNOSIS — D649 Anemia, unspecified: Secondary | ICD-10-CM | POA: Diagnosis not present

## 2016-01-22 DIAGNOSIS — E119 Type 2 diabetes mellitus without complications: Secondary | ICD-10-CM | POA: Diagnosis not present

## 2016-01-22 DIAGNOSIS — I10 Essential (primary) hypertension: Secondary | ICD-10-CM | POA: Diagnosis not present

## 2016-01-22 DIAGNOSIS — Z79899 Other long term (current) drug therapy: Secondary | ICD-10-CM | POA: Diagnosis not present

## 2016-01-22 DIAGNOSIS — Z0000 Encounter for general adult medical examination without abnormal findings: Secondary | ICD-10-CM | POA: Diagnosis not present

## 2016-01-22 DIAGNOSIS — E118 Type 2 diabetes mellitus with unspecified complications: Secondary | ICD-10-CM | POA: Diagnosis not present

## 2016-01-22 DIAGNOSIS — E789 Disorder of lipoprotein metabolism, unspecified: Secondary | ICD-10-CM | POA: Diagnosis not present

## 2016-01-29 DIAGNOSIS — E118 Type 2 diabetes mellitus with unspecified complications: Secondary | ICD-10-CM | POA: Diagnosis not present

## 2016-01-29 DIAGNOSIS — E119 Type 2 diabetes mellitus without complications: Secondary | ICD-10-CM | POA: Diagnosis not present

## 2016-01-29 DIAGNOSIS — Z23 Encounter for immunization: Secondary | ICD-10-CM | POA: Diagnosis not present

## 2016-01-29 DIAGNOSIS — I10 Essential (primary) hypertension: Secondary | ICD-10-CM | POA: Diagnosis not present

## 2016-01-29 DIAGNOSIS — E789 Disorder of lipoprotein metabolism, unspecified: Secondary | ICD-10-CM | POA: Diagnosis not present

## 2016-02-25 DIAGNOSIS — Z1231 Encounter for screening mammogram for malignant neoplasm of breast: Secondary | ICD-10-CM | POA: Diagnosis not present

## 2016-03-04 DIAGNOSIS — H25013 Cortical age-related cataract, bilateral: Secondary | ICD-10-CM | POA: Diagnosis not present

## 2016-03-04 DIAGNOSIS — H5213 Myopia, bilateral: Secondary | ICD-10-CM | POA: Diagnosis not present

## 2016-03-04 DIAGNOSIS — H52203 Unspecified astigmatism, bilateral: Secondary | ICD-10-CM | POA: Diagnosis not present

## 2016-03-04 DIAGNOSIS — H524 Presbyopia: Secondary | ICD-10-CM | POA: Diagnosis not present

## 2016-03-04 DIAGNOSIS — E119 Type 2 diabetes mellitus without complications: Secondary | ICD-10-CM | POA: Diagnosis not present

## 2016-05-14 DIAGNOSIS — I10 Essential (primary) hypertension: Secondary | ICD-10-CM | POA: Diagnosis not present

## 2016-05-14 DIAGNOSIS — E789 Disorder of lipoprotein metabolism, unspecified: Secondary | ICD-10-CM | POA: Diagnosis not present

## 2016-05-14 DIAGNOSIS — E119 Type 2 diabetes mellitus without complications: Secondary | ICD-10-CM | POA: Diagnosis not present

## 2016-06-01 DIAGNOSIS — I10 Essential (primary) hypertension: Secondary | ICD-10-CM | POA: Diagnosis not present

## 2016-06-01 DIAGNOSIS — K219 Gastro-esophageal reflux disease without esophagitis: Secondary | ICD-10-CM | POA: Diagnosis not present

## 2016-06-01 DIAGNOSIS — E118 Type 2 diabetes mellitus with unspecified complications: Secondary | ICD-10-CM | POA: Diagnosis not present

## 2016-06-01 DIAGNOSIS — E789 Disorder of lipoprotein metabolism, unspecified: Secondary | ICD-10-CM | POA: Diagnosis not present

## 2016-07-28 ENCOUNTER — Encounter: Payer: Self-pay | Admitting: Gynecology

## 2016-08-25 DIAGNOSIS — N390 Urinary tract infection, site not specified: Secondary | ICD-10-CM | POA: Diagnosis not present

## 2016-08-25 DIAGNOSIS — E789 Disorder of lipoprotein metabolism, unspecified: Secondary | ICD-10-CM | POA: Diagnosis not present

## 2016-08-25 DIAGNOSIS — E118 Type 2 diabetes mellitus with unspecified complications: Secondary | ICD-10-CM | POA: Diagnosis not present

## 2016-09-01 DIAGNOSIS — E118 Type 2 diabetes mellitus with unspecified complications: Secondary | ICD-10-CM | POA: Diagnosis not present

## 2016-09-01 DIAGNOSIS — E789 Disorder of lipoprotein metabolism, unspecified: Secondary | ICD-10-CM | POA: Diagnosis not present

## 2016-09-01 DIAGNOSIS — I10 Essential (primary) hypertension: Secondary | ICD-10-CM | POA: Diagnosis not present

## 2016-12-27 DIAGNOSIS — I10 Essential (primary) hypertension: Secondary | ICD-10-CM | POA: Diagnosis not present

## 2016-12-27 DIAGNOSIS — E119 Type 2 diabetes mellitus without complications: Secondary | ICD-10-CM | POA: Diagnosis not present

## 2017-01-03 ENCOUNTER — Encounter: Payer: Self-pay | Admitting: Internal Medicine

## 2017-01-03 DIAGNOSIS — I10 Essential (primary) hypertension: Secondary | ICD-10-CM | POA: Diagnosis not present

## 2017-01-03 DIAGNOSIS — E789 Disorder of lipoprotein metabolism, unspecified: Secondary | ICD-10-CM | POA: Diagnosis not present

## 2017-01-03 DIAGNOSIS — Z79899 Other long term (current) drug therapy: Secondary | ICD-10-CM | POA: Diagnosis not present

## 2017-01-03 DIAGNOSIS — E119 Type 2 diabetes mellitus without complications: Secondary | ICD-10-CM | POA: Diagnosis not present

## 2017-01-03 DIAGNOSIS — Z23 Encounter for immunization: Secondary | ICD-10-CM | POA: Diagnosis not present

## 2017-02-28 DIAGNOSIS — Z1231 Encounter for screening mammogram for malignant neoplasm of breast: Secondary | ICD-10-CM | POA: Diagnosis not present

## 2017-03-17 DIAGNOSIS — H5213 Myopia, bilateral: Secondary | ICD-10-CM | POA: Diagnosis not present

## 2017-03-17 DIAGNOSIS — H52203 Unspecified astigmatism, bilateral: Secondary | ICD-10-CM | POA: Diagnosis not present

## 2017-03-17 DIAGNOSIS — H524 Presbyopia: Secondary | ICD-10-CM | POA: Diagnosis not present

## 2017-03-17 DIAGNOSIS — E119 Type 2 diabetes mellitus without complications: Secondary | ICD-10-CM | POA: Diagnosis not present

## 2017-03-17 DIAGNOSIS — H25013 Cortical age-related cataract, bilateral: Secondary | ICD-10-CM | POA: Diagnosis not present

## 2017-05-11 DIAGNOSIS — Z5181 Encounter for therapeutic drug level monitoring: Secondary | ICD-10-CM | POA: Diagnosis not present

## 2017-05-11 DIAGNOSIS — Z79899 Other long term (current) drug therapy: Secondary | ICD-10-CM | POA: Diagnosis not present

## 2017-05-11 DIAGNOSIS — E119 Type 2 diabetes mellitus without complications: Secondary | ICD-10-CM | POA: Diagnosis not present

## 2017-05-11 DIAGNOSIS — I10 Essential (primary) hypertension: Secondary | ICD-10-CM | POA: Diagnosis not present

## 2017-05-16 DIAGNOSIS — Z79899 Other long term (current) drug therapy: Secondary | ICD-10-CM | POA: Diagnosis not present

## 2017-05-16 DIAGNOSIS — I10 Essential (primary) hypertension: Secondary | ICD-10-CM | POA: Diagnosis not present

## 2017-05-16 DIAGNOSIS — E119 Type 2 diabetes mellitus without complications: Secondary | ICD-10-CM | POA: Diagnosis not present

## 2017-05-16 DIAGNOSIS — E789 Disorder of lipoprotein metabolism, unspecified: Secondary | ICD-10-CM | POA: Diagnosis not present

## 2017-05-16 DIAGNOSIS — D649 Anemia, unspecified: Secondary | ICD-10-CM | POA: Diagnosis not present

## 2017-08-12 DIAGNOSIS — R0789 Other chest pain: Secondary | ICD-10-CM | POA: Diagnosis not present

## 2017-08-12 DIAGNOSIS — I10 Essential (primary) hypertension: Secondary | ICD-10-CM | POA: Diagnosis not present

## 2017-08-19 DIAGNOSIS — I10 Essential (primary) hypertension: Secondary | ICD-10-CM | POA: Diagnosis not present

## 2017-08-23 DIAGNOSIS — Z780 Asymptomatic menopausal state: Secondary | ICD-10-CM | POA: Diagnosis not present

## 2017-08-24 DIAGNOSIS — E119 Type 2 diabetes mellitus without complications: Secondary | ICD-10-CM | POA: Diagnosis not present

## 2017-08-24 DIAGNOSIS — I10 Essential (primary) hypertension: Secondary | ICD-10-CM | POA: Diagnosis not present

## 2017-08-24 DIAGNOSIS — Z79899 Other long term (current) drug therapy: Secondary | ICD-10-CM | POA: Diagnosis not present

## 2017-08-24 DIAGNOSIS — D649 Anemia, unspecified: Secondary | ICD-10-CM | POA: Diagnosis not present

## 2017-08-24 DIAGNOSIS — D509 Iron deficiency anemia, unspecified: Secondary | ICD-10-CM | POA: Diagnosis not present

## 2017-08-24 DIAGNOSIS — Z5181 Encounter for therapeutic drug level monitoring: Secondary | ICD-10-CM | POA: Diagnosis not present

## 2017-08-25 DIAGNOSIS — R9431 Abnormal electrocardiogram [ECG] [EKG]: Secondary | ICD-10-CM | POA: Diagnosis not present

## 2017-08-25 DIAGNOSIS — E1165 Type 2 diabetes mellitus with hyperglycemia: Secondary | ICD-10-CM | POA: Diagnosis not present

## 2017-08-25 DIAGNOSIS — I10 Essential (primary) hypertension: Secondary | ICD-10-CM | POA: Diagnosis not present

## 2017-08-25 DIAGNOSIS — E785 Hyperlipidemia, unspecified: Secondary | ICD-10-CM | POA: Diagnosis not present

## 2017-08-25 DIAGNOSIS — R002 Palpitations: Secondary | ICD-10-CM | POA: Diagnosis not present

## 2017-09-07 ENCOUNTER — Other Ambulatory Visit: Payer: Self-pay

## 2017-09-07 NOTE — Patient Outreach (Signed)
[place] [facility]) Care Management  09/07/2017  Carrie [name] February 01, 1946 [id number]   Medication Adherence call to [name] spoke with patient she is no longer taking Pravastatin 20 mg doctor took her off ,she is now taking Simvastatin,on Telmisartan  she still has medication for about 7 more days she will order this medication herself.[name] is showing past due under [place].   [place] Management Direct Dial [phone]  Fax [phone] [email or web address]

## 2017-09-13 ENCOUNTER — Other Ambulatory Visit: Payer: Self-pay

## 2017-09-13 NOTE — Patient Outreach (Signed)
[place] [facility]) Care Management  09/13/2017  PATCHES MCDONNELL 24-Aug-1945 [id number]   Medication Adherence call to [name] patienti s showing past due under [place] on Pravastatin 20 mg patient is no longer taking this medication she is now taking Simvastatin.  [place] Management Direct Dial [phone]  Fax [phone] [email or web address]

## 2017-10-04 DIAGNOSIS — R9431 Abnormal electrocardiogram [ECG] [EKG]: Secondary | ICD-10-CM | POA: Diagnosis not present

## 2017-10-04 DIAGNOSIS — I10 Essential (primary) hypertension: Secondary | ICD-10-CM | POA: Diagnosis not present

## 2017-10-10 DIAGNOSIS — E119 Type 2 diabetes mellitus without complications: Secondary | ICD-10-CM | POA: Diagnosis not present

## 2017-10-10 DIAGNOSIS — E789 Disorder of lipoprotein metabolism, unspecified: Secondary | ICD-10-CM | POA: Diagnosis not present

## 2017-10-10 DIAGNOSIS — E611 Iron deficiency: Secondary | ICD-10-CM | POA: Diagnosis not present

## 2017-10-10 DIAGNOSIS — I10 Essential (primary) hypertension: Secondary | ICD-10-CM | POA: Diagnosis not present

## 2017-10-10 DIAGNOSIS — Z79899 Other long term (current) drug therapy: Secondary | ICD-10-CM | POA: Diagnosis not present

## 2017-10-13 DIAGNOSIS — R9431 Abnormal electrocardiogram [ECG] [EKG]: Secondary | ICD-10-CM | POA: Diagnosis not present

## 2017-10-13 DIAGNOSIS — R002 Palpitations: Secondary | ICD-10-CM | POA: Diagnosis not present

## 2017-10-13 DIAGNOSIS — I10 Essential (primary) hypertension: Secondary | ICD-10-CM | POA: Diagnosis not present

## 2017-10-13 DIAGNOSIS — E1165 Type 2 diabetes mellitus with hyperglycemia: Secondary | ICD-10-CM | POA: Diagnosis not present

## 2017-12-06 DIAGNOSIS — E119 Type 2 diabetes mellitus without complications: Secondary | ICD-10-CM | POA: Diagnosis not present

## 2017-12-06 DIAGNOSIS — I10 Essential (primary) hypertension: Secondary | ICD-10-CM | POA: Diagnosis not present

## 2017-12-06 DIAGNOSIS — E611 Iron deficiency: Secondary | ICD-10-CM | POA: Diagnosis not present

## 2017-12-12 DIAGNOSIS — K219 Gastro-esophageal reflux disease without esophagitis: Secondary | ICD-10-CM | POA: Diagnosis not present

## 2017-12-12 DIAGNOSIS — Z23 Encounter for immunization: Secondary | ICD-10-CM | POA: Diagnosis not present

## 2017-12-12 DIAGNOSIS — E119 Type 2 diabetes mellitus without complications: Secondary | ICD-10-CM | POA: Diagnosis not present

## 2017-12-12 DIAGNOSIS — I10 Essential (primary) hypertension: Secondary | ICD-10-CM | POA: Diagnosis not present

## 2018-01-11 DIAGNOSIS — R0789 Other chest pain: Secondary | ICD-10-CM | POA: Diagnosis not present

## 2018-01-11 DIAGNOSIS — R42 Dizziness and giddiness: Secondary | ICD-10-CM | POA: Diagnosis not present

## 2018-01-11 DIAGNOSIS — I10 Essential (primary) hypertension: Secondary | ICD-10-CM | POA: Diagnosis not present

## 2018-01-12 DIAGNOSIS — R002 Palpitations: Secondary | ICD-10-CM | POA: Diagnosis not present

## 2018-01-25 DIAGNOSIS — R42 Dizziness and giddiness: Secondary | ICD-10-CM | POA: Diagnosis not present

## 2018-01-25 DIAGNOSIS — R0789 Other chest pain: Secondary | ICD-10-CM | POA: Diagnosis not present

## 2018-01-31 DIAGNOSIS — R0789 Other chest pain: Secondary | ICD-10-CM | POA: Diagnosis not present

## 2018-03-01 DIAGNOSIS — Z1231 Encounter for screening mammogram for malignant neoplasm of breast: Secondary | ICD-10-CM | POA: Diagnosis not present

## 2018-03-02 DIAGNOSIS — I10 Essential (primary) hypertension: Secondary | ICD-10-CM | POA: Diagnosis not present

## 2018-03-02 DIAGNOSIS — R9431 Abnormal electrocardiogram [ECG] [EKG]: Secondary | ICD-10-CM | POA: Diagnosis not present

## 2018-03-02 DIAGNOSIS — Z0189 Encounter for other specified special examinations: Secondary | ICD-10-CM | POA: Diagnosis not present

## 2018-03-02 DIAGNOSIS — R002 Palpitations: Secondary | ICD-10-CM | POA: Diagnosis not present

## 2018-03-31 DIAGNOSIS — E789 Disorder of lipoprotein metabolism, unspecified: Secondary | ICD-10-CM | POA: Diagnosis not present

## 2018-03-31 DIAGNOSIS — I10 Essential (primary) hypertension: Secondary | ICD-10-CM | POA: Diagnosis not present

## 2018-03-31 DIAGNOSIS — E119 Type 2 diabetes mellitus without complications: Secondary | ICD-10-CM | POA: Diagnosis not present

## 2018-04-07 DIAGNOSIS — E789 Disorder of lipoprotein metabolism, unspecified: Secondary | ICD-10-CM | POA: Diagnosis not present

## 2018-04-07 DIAGNOSIS — D649 Anemia, unspecified: Secondary | ICD-10-CM | POA: Diagnosis not present

## 2018-04-07 DIAGNOSIS — E119 Type 2 diabetes mellitus without complications: Secondary | ICD-10-CM | POA: Diagnosis not present

## 2018-04-07 DIAGNOSIS — I10 Essential (primary) hypertension: Secondary | ICD-10-CM | POA: Diagnosis not present

## 2018-04-10 DIAGNOSIS — R002 Palpitations: Secondary | ICD-10-CM | POA: Diagnosis not present

## 2018-04-10 DIAGNOSIS — Z0189 Encounter for other specified special examinations: Secondary | ICD-10-CM | POA: Diagnosis not present

## 2018-04-10 DIAGNOSIS — R9431 Abnormal electrocardiogram [ECG] [EKG]: Secondary | ICD-10-CM | POA: Diagnosis not present

## 2018-04-10 DIAGNOSIS — I10 Essential (primary) hypertension: Secondary | ICD-10-CM | POA: Diagnosis not present

## 2018-04-18 ENCOUNTER — Telehealth: Payer: Self-pay

## 2018-04-25 ENCOUNTER — Telehealth: Payer: Self-pay

## 2018-04-25 ENCOUNTER — Other Ambulatory Visit: Payer: Self-pay | Admitting: Cardiology

## 2018-04-25 DIAGNOSIS — I10 Essential (primary) hypertension: Secondary | ICD-10-CM

## 2018-04-25 NOTE — Telephone Encounter (Signed)
Pt needs to schedule exercise Myoview. Orginal appt in allscripts needs to be rescheduled.

## 2018-05-02 ENCOUNTER — Telehealth: Payer: Self-pay

## 2018-05-02 NOTE — Telephone Encounter (Signed)
Patient will call back to sch this stress test as she mentioned her husband is having different appointments this week.

## 2018-05-18 NOTE — Telephone Encounter (Signed)
I called Carrie [name] to schedule her nuc. She did not want to schedule at this time and stated she will call back.

## 2018-06-08 ENCOUNTER — Other Ambulatory Visit: Payer: Self-pay | Admitting: Cardiology

## 2018-06-30 DIAGNOSIS — E789 Disorder of lipoprotein metabolism, unspecified: Secondary | ICD-10-CM | POA: Diagnosis not present

## 2018-06-30 DIAGNOSIS — E119 Type 2 diabetes mellitus without complications: Secondary | ICD-10-CM | POA: Diagnosis not present

## 2018-06-30 DIAGNOSIS — I10 Essential (primary) hypertension: Secondary | ICD-10-CM | POA: Diagnosis not present

## 2018-07-07 DIAGNOSIS — I10 Essential (primary) hypertension: Secondary | ICD-10-CM | POA: Diagnosis not present

## 2018-07-07 DIAGNOSIS — D649 Anemia, unspecified: Secondary | ICD-10-CM | POA: Diagnosis not present

## 2018-07-07 DIAGNOSIS — E118 Type 2 diabetes mellitus with unspecified complications: Secondary | ICD-10-CM | POA: Diagnosis not present

## 2018-07-14 ENCOUNTER — Other Ambulatory Visit: Payer: Self-pay

## 2018-07-14 NOTE — Patient Outreach (Signed)
[place] [facility]) Care Management  07/14/2018  Carrie [name] [birth year] [id number]   Medication Adherence call to Carrie [name] Compliant Voice message left with a call back number. Carrie [name] is showing past due under [place].   [place] Management Direct Dial [phone]  Fax [phone] [email or web address]

## 2018-07-28 ENCOUNTER — Telehealth: Payer: Self-pay

## 2018-07-28 NOTE — Telephone Encounter (Signed)
Pt called stating that she spoke with someone at [facility] and they told her that there is a pill form that is much cheaper than the patch. Please advise.//ah

## 2018-07-28 NOTE — Telephone Encounter (Signed)
Clonidine patch has worked well for her. If she wants to change to PO let me know. I prefer her to continue the patch

## 2018-07-31 NOTE — Telephone Encounter (Signed)
Pt said that it from $24 to $97 for a 30day supply. She would still like to try the pill and if it does not work, she will make the sacrifice and pay the $97.

## 2018-07-31 NOTE — Telephone Encounter (Signed)
Catapres 0.2 mg PO BID

## 2018-08-01 MED ORDER — CLONIDINE HCL 0.2 MG PO TABS: 0.2000 mg | ORAL_TABLET | Freq: Two times a day (BID) | ORAL | 1 refills | Status: DC

## 2018-08-01 NOTE — Telephone Encounter (Signed)
Pt aware. Rx sent to pharmacy.//ah

## 2018-08-29 ENCOUNTER — Other Ambulatory Visit: Payer: Self-pay

## 2018-08-29 NOTE — Patient Outreach (Signed)
[place] [facility]) Care Management  08/29/2018  EMANUEL CAMPOS 03-22-1945 [id number]   Medication Adherence call to Mrs. [place] Compliant Voice message left with a call back number. Mrs. Biscardi is showing past due on Janumet 50/1000 under [place].   [place] Management Direct Dial [phone]  Fax [phone] [email or web address]

## 2018-08-29 NOTE — Patient Outreach (Signed)
[place] [facility]) Care Management  08/29/2018  Carrie [name] 1945-06-09 [id number]   Medication Adherence call to Carrie [name] Compliant Voice message left with a call back number. Carrie [name] is showing past due on Janumet 50/1000 under Carrie [name].   [place] Management Direct Dial [phone]  Fax [phone] [email or web address]

## 2018-09-05 ENCOUNTER — Telehealth: Payer: Self-pay

## 2018-09-05 NOTE — Telephone Encounter (Signed)
YES 1/2 BID

## 2018-09-05 NOTE — Telephone Encounter (Signed)
Pt aware.

## 2018-09-05 NOTE — Telephone Encounter (Signed)
Pt called stating that we switched her from the clonidine patch to the pill per her choice but when she took the first one, it made her feel lightheaded (woozy). She tried 1/2 tab which did not make her feel as bad but she only took it once a day instead of bid. Is it ok for to be taking 1/2 a tab and if so should she try to do bid or keep at once daily. Also she said that she read that you are not supposed to take Metoprolol with Clonidine. Please advise on both.//ah

## 2018-09-06 NOTE — Telephone Encounter (Signed)
Yes. Her BP control was very difficult and making too many changes may not be apprpriate

## 2018-09-06 NOTE — Telephone Encounter (Signed)
Ok to take metoprolol and clonidine together?

## 2018-09-07 NOTE — Telephone Encounter (Signed)
Pt aware.//ah

## 2018-09-26 DIAGNOSIS — E789 Disorder of lipoprotein metabolism, unspecified: Secondary | ICD-10-CM | POA: Diagnosis not present

## 2018-09-26 DIAGNOSIS — E119 Type 2 diabetes mellitus without complications: Secondary | ICD-10-CM | POA: Diagnosis not present

## 2018-09-26 DIAGNOSIS — I10 Essential (primary) hypertension: Secondary | ICD-10-CM | POA: Diagnosis not present

## 2018-09-26 DIAGNOSIS — D509 Iron deficiency anemia, unspecified: Secondary | ICD-10-CM | POA: Diagnosis not present

## 2018-09-26 DIAGNOSIS — D649 Anemia, unspecified: Secondary | ICD-10-CM | POA: Diagnosis not present

## 2018-09-27 DIAGNOSIS — E119 Type 2 diabetes mellitus without complications: Secondary | ICD-10-CM | POA: Diagnosis not present

## 2018-10-05 DIAGNOSIS — D509 Iron deficiency anemia, unspecified: Secondary | ICD-10-CM | POA: Diagnosis not present

## 2018-10-09 ENCOUNTER — Ambulatory Visit: Payer: Self-pay | Admitting: Cardiology

## 2018-10-19 ENCOUNTER — Encounter: Payer: Self-pay | Admitting: Cardiology

## 2018-10-19 DIAGNOSIS — E119 Type 2 diabetes mellitus without complications: Secondary | ICD-10-CM

## 2018-10-19 DIAGNOSIS — E785 Hyperlipidemia, unspecified: Secondary | ICD-10-CM

## 2018-10-19 DIAGNOSIS — R002 Palpitations: Secondary | ICD-10-CM | POA: Insufficient documentation

## 2018-10-19 DIAGNOSIS — I10 Essential (primary) hypertension: Secondary | ICD-10-CM

## 2018-10-19 HISTORY — DX: Type 2 diabetes mellitus without complications: E11.9

## 2018-10-19 HISTORY — DX: Essential (primary) hypertension: I10

## 2018-10-19 HISTORY — DX: Hyperlipidemia, unspecified: E78.5

## 2018-10-19 HISTORY — DX: Palpitations: R00.2

## 2018-10-20 ENCOUNTER — Other Ambulatory Visit: Payer: Self-pay

## 2018-10-20 ENCOUNTER — Ambulatory Visit: Payer: Medicare Other | Admitting: Cardiology

## 2018-10-20 ENCOUNTER — Encounter: Payer: Self-pay | Admitting: Cardiology

## 2018-10-20 VITALS — BP 140/79 | HR 73 | Ht 61.0 in | Wt 143.0 lb

## 2018-10-20 DIAGNOSIS — R9431 Abnormal electrocardiogram [ECG] [EKG]: Secondary | ICD-10-CM

## 2018-10-20 DIAGNOSIS — R002 Palpitations: Secondary | ICD-10-CM

## 2018-10-20 DIAGNOSIS — I10 Essential (primary) hypertension: Secondary | ICD-10-CM

## 2018-10-20 DIAGNOSIS — E785 Hyperlipidemia, unspecified: Secondary | ICD-10-CM | POA: Diagnosis not present

## 2018-10-20 NOTE — Progress Notes (Signed)
Primary Physician/Referring:  Anda Kraft, MD  Patient ID: Carrie [name], female    DOB: 07-Mar-1946, 73 y.o.   MRN: [id number]  Chief Complaint  Patient presents with  . Hypertension  . Palpitations  . Follow-up   HPI:    HPI: Carrie [name]  is a 73 y.o. African-American female who I had seen about 6 months ago, she was evaluated for palpitations, which are chronic by event monitor and also she has difficult to control hypertension and has been on multiple medications, renal duplex in June 2019 did not reveal renal artery stenosis.  Patient with past medical history of hypertension, hyperlipidemia, uncontrolled type 2 diabetes. Patient had called [place] in view of dizziness, uncontrolled hypertension, wanted to make changes over the telephone.  I recommended that in view of extremely difficult to control hypertension, complex medical history, not to make any changes without seeing this, she now presents here for follow-up.  I last seen her 6 months ago in January 2020.    Past Medical History:  Diagnosis Date  . Anemia   . Diabetes mellitus   . DM (diabetes mellitus) ([place]) 10/19/2018  . HLD (hyperlipidemia) 10/19/2018  . HTN (hypertension) 10/19/2018  . Hypertension   . Palpitations 10/19/2018   Past Surgical History:  Procedure Laterality Date  . ABDOMINAL HYSTERECTOMY     1993  . CESAREAN SECTION     Social History   Socioeconomic History  . Marital status: Married    Spouse name: Not on file  . Number of children: 0  . Years of education: Not on file  . Highest education level: Not on file  Occupational History  . Not on file  Social Needs  . Financial resource strain: Not on file  . Food insecurity    Worry: Not on file    Inability: Not on file  . Transportation needs    Medical: Not on file    Non-medical: Not on file  Tobacco Use  . Smoking status: Never Smoker  . Smokeless tobacco: Never Used  Substance and Sexual Activity  . Alcohol use: No  . Drug use:  No  . Sexual activity: Yes    Birth control/protection: Surgical  Lifestyle  . Physical activity    Days per week: Not on file    Minutes per session: Not on file  . Stress: Not on file  Relationships  . Social Herbalist on phone: Not on file    Gets together: Not on file    Attends religious service: Not on file    Active member of club or organization: Not on file    Attends meetings of clubs or organizations: Not on file    Relationship status: Not on file  . Intimate partner violence    Fear of current or ex partner: Not on file    Emotionally abused: Not on file    Physically abused: Not on file    Forced sexual activity: Not on file  Other Topics Concern  . Not on file  Social History Narrative  . Not on file   ROS  Review of Systems  Constitution: Negative for chills, decreased appetite, malaise/fatigue and weight gain.  Cardiovascular: Positive for palpitations. Negative for dyspnea on exertion, leg swelling and syncope.  Endocrine: Negative for cold intolerance.  Hematologic/Lymphatic: Does not bruise/bleed easily.  Musculoskeletal: Negative for joint swelling.  Gastrointestinal: Negative for abdominal pain, anorexia, change in bowel habit, hematochezia and melena.  Neurological: Negative  for headaches and light-headedness.  Psychiatric/Behavioral: Negative for depression and substance abuse.  All other systems reviewed and are negative.  Objective  Blood pressure 140/79, pulse 73, height _0  (1.549 m), weight 143 lb (64.9 kg), SpO2 100 %. Body mass index is 27.02 kg/m.   Physical Exam  Constitutional: She appears well-developed and well-nourished. No distress.  HENT:  Head: Atraumatic.  Eyes: Conjunctivae are normal.  Neck: Neck supple. No JVD present. No thyromegaly present.  Cardiovascular: Normal rate, regular rhythm, normal heart sounds and intact distal pulses. Exam reveals no gallop.  No murmur heard. Pulmonary/Chest: Effort normal and  breath sounds normal. She has no wheezes.  Abdominal: Soft. Bowel sounds are normal.  Musculoskeletal: Normal range of motion.  Neurological: She is alert.  Skin: Skin is warm and dry.  Psychiatric: She has a normal mood and affect.   Radiology: No results found.  Laboratory examination:    03/31/2018: Hct 35.8, microcytic indicies, CBC otherwsie normal. Creatinine 0.87, eGFR 68, potassium 4.6, CMP normal. HgbA1c 7.8%. Cholesterol 208, triglycerides 78, HDL 56, LDL 136.  04/03/18: Creatinine 0.87, EGFR 68/78, potassium 4.6, CMP normal.  CMP Latest Ref Rng & Units 06/09/2011 07/26/2008 07/24/2008  Glucose 70 - 99 mg/dL 147(H) 129(H) 134(H)  BUN 6 - 23 mg/dL _1 Creatinine 0.50 - 1.10 mg/dL 0.80 0.49 0.47  Sodium 135 - 145 mEq/L 138 137 136  Potassium 3.5 - 5.1 mEq/L 3.9 3.1 RESULT REPEATED AND VERIFIED(L) 4.0  Chloride 96 - 112 mEq/L 101 100 98  CO2 19 - 32 mEq/L - 32 32  Calcium 8.4 - 10.5 mg/dL - 9.8 9.8  Total Protein 6.0 - 8.3 g/dL - 6.6 -  Total Bilirubin 0.3 - 1.2 mg/dL - 0.4 -  Alkaline Phos 39 - 117 U/L - 70 -  AST 0 - 37 U/L - 14 -  ALT 0 - 35 U/L - 14 -   CBC Latest Ref Rng & Units 06/09/2011 06/09/2011 07/26/2008  WBC 4.0 - 10.5 K/uL - 7.3 7.2  Hemoglobin 12.0 - 15.0 g/dL 11.2(L) 10.8(L) 11.0(L)  Hematocrit 36.0 - 46.0 % 33.0(L) 32.1(L) 32.3(L)  Platelets 150 - 400 K/uL - 345 370   Lipid Panel  No results found for: CHOL, TRIG, HDL, CHOLHDL, VLDL, LDLCALC, LDLDIRECT HEMOGLOBIN A1C Lab Results  Component Value Date   HGBA1C (H) 07/24/2008    7.4 (NOTE) The ADA recommends the following therapeutic goal for glycemic control related to Hgb A1c measurement: Goal of therapy: <6.5 Hgb A1c  Reference: American Diabetes Association: Clinical Practice Recommendations 2010, Diabetes Care, 2010, 33: (Suppl  1).   MPG 166 07/24/2008   TSH No results for input(s): TSH in the last 8760 hours. Medications   Current Outpatient Medications  Medication Instructions  .  amLODipine (NORVASC) 10 mg, Oral, Daily  . cloNIDine (CATAPRES) 0.2 mg, Oral, 2 times daily  . diazepam (VALIUM) 5 mg, Oral, Every 6 hours PRN  . empagliflozin (JARDIANCE) 25 mg, Oral, Daily  . esomeprazole (NEXIUM) 40 mg, Oral, Daily PRN,    . hydrochlorothiazide (HYDRODIURIL) 25 mg, Oral, Daily  . meloxicam (MOBIC) 15 mg, Oral, Daily PRN  . metoprolol succinate (TOPROL-XL) 100 mg, Daily  . sitaGLIPtin-metformin (JANUMET) 50-1000 MG tablet 1 tablet, Oral, 2 times daily with meals  . spironolactone (ALDACTONE) 25 mg, Oral, Daily  . telmisartan (MICARDIS) 40 mg, Oral, Daily    Cardiac Studies:    Zio Patch 7 days 01/12/2018: Maximum heart rate 148 bpm, minimum heart rate  52 bpm. Frequent SVT occurred, 1218. Fastest 15 beats at 03/15/38 8 bpm, longest 3 minutes at 117 bpm, tracing suggest atrial tachycardia. Isolated PACs and PVCs. Symptoms correlated with atrial tachycardia.  Echocardiogram 10/04/2017: Left ventricle cavity is normal in size. Moderate concentric hypertrophy of the left ventricle. Normal global wall motion. Doppler evidence of grade II (pseudonormal) diastolic dysfunction, elevated LAP. Calculated EF 64%. Left atrial cavity is mildly dilated. Mild tricuspid regurgitation. Estimated pulmonary artery systolic pressure 23 mmHg. Mild pulmonic regurgitation.  Renal artery duplex 10/04/2017: No evidence of renal artery occlusive disease in either renal artery. Normal renal size bilateral. Increased echogenicity and resistive index suggests medico-renal disease.  Assessment     ICD-10-CM   1. Essential hypertension  I10 EKG 12-Lead  2. Palpitations  R00.2 EKG 12-Lead  3. Nonspecific abnormal electrocardiogram (ECG) (EKG)  R94.31   4. Mild hyperlipidemia  E78.5     EKG 10/20/2018: Sinus rhythm with first-degree AV block at rate of 68 bpm, left axis deviation, left anterior fascicular block.  Progression, cannot exclude anteroseptal infarct old.  IVCD, LVH.  Diffuse nonspecific  ST-T abnormal.EKG. No significant change from 03/02/2018  Recommendations:   Patient is here on a six-month office visit and follow-up of difficult to control hypertension, since being on clonidine her blood pressure has been very well controlled and she brings in home recordings which revealed systolic blood pressure to be around 130 mmHg.  I reviewed her blood pressure chart, no change in physical exam, she is stable from cardiac standpoint.  I'll see her back on a p.r.n. basis.  She does have mild hyperlipidemia and this needs to be followed up.  Due to abnormal EKG, a nuclear stress test was ordered previously prior to COVID-19, this was never done, however, patient remains asymptomatic without dyspnea or chest pain and no clinical evidence of heart failure, suspect EKG abnormality related to hypertension.  Hence the test has been canceled.  Adrian Prows, MD, [facility] 10/20/2018, 6:13 PM [facility]. [place] Pager: [phone] Office: [phone] If no answer Cell [phone]

## 2018-10-23 DIAGNOSIS — E789 Disorder of lipoprotein metabolism, unspecified: Secondary | ICD-10-CM | POA: Diagnosis not present

## 2018-10-23 DIAGNOSIS — I10 Essential (primary) hypertension: Secondary | ICD-10-CM | POA: Diagnosis not present

## 2018-10-23 DIAGNOSIS — K219 Gastro-esophageal reflux disease without esophagitis: Secondary | ICD-10-CM | POA: Diagnosis not present

## 2018-10-23 DIAGNOSIS — D509 Iron deficiency anemia, unspecified: Secondary | ICD-10-CM | POA: Diagnosis not present

## 2018-10-23 DIAGNOSIS — E118 Type 2 diabetes mellitus with unspecified complications: Secondary | ICD-10-CM | POA: Diagnosis not present

## 2018-10-25 DIAGNOSIS — I10 Essential (primary) hypertension: Secondary | ICD-10-CM | POA: Diagnosis not present

## 2018-10-25 DIAGNOSIS — E119 Type 2 diabetes mellitus without complications: Secondary | ICD-10-CM | POA: Diagnosis not present

## 2018-10-25 DIAGNOSIS — D131 Benign neoplasm of stomach: Secondary | ICD-10-CM | POA: Diagnosis not present

## 2018-10-25 DIAGNOSIS — D500 Iron deficiency anemia secondary to blood loss (chronic): Secondary | ICD-10-CM | POA: Diagnosis not present

## 2018-10-31 DIAGNOSIS — D500 Iron deficiency anemia secondary to blood loss (chronic): Secondary | ICD-10-CM | POA: Diagnosis not present

## 2018-10-31 DIAGNOSIS — D509 Iron deficiency anemia, unspecified: Secondary | ICD-10-CM | POA: Diagnosis not present

## 2018-12-19 DIAGNOSIS — Z23 Encounter for immunization: Secondary | ICD-10-CM | POA: Diagnosis not present

## 2019-01-12 ENCOUNTER — Other Ambulatory Visit: Payer: Self-pay | Admitting: Internal Medicine

## 2019-01-12 ENCOUNTER — Encounter (HOSPITAL_COMMUNITY): Payer: Self-pay | Admitting: Pharmacy Technician

## 2019-01-12 ENCOUNTER — Other Ambulatory Visit: Payer: Self-pay

## 2019-01-12 ENCOUNTER — Ambulatory Visit
Admission: RE | Admit: 2019-01-12 | Discharge: 2019-01-12 | Disposition: A | Discharge status: Home / Self Care | Payer: Medicare Other | Source: Ambulatory Visit | Attending: Internal Medicine | Admitting: Internal Medicine

## 2019-01-12 ENCOUNTER — Observation Stay (HOSPITAL_COMMUNITY)
Admission: EM | Admit: 2019-01-12 | Discharge: 2019-01-13 | Disposition: A | Discharge status: Home / Self Care | Payer: Medicare Other | Attending: Family Medicine | Admitting: Family Medicine

## 2019-01-12 DIAGNOSIS — Z79899 Other long term (current) drug therapy: Secondary | ICD-10-CM | POA: Insufficient documentation

## 2019-01-12 DIAGNOSIS — D649 Anemia, unspecified: Secondary | ICD-10-CM | POA: Diagnosis not present

## 2019-01-12 DIAGNOSIS — Z8249 Family history of ischemic heart disease and other diseases of the circulatory system: Secondary | ICD-10-CM | POA: Diagnosis not present

## 2019-01-12 DIAGNOSIS — I10 Essential (primary) hypertension: Secondary | ICD-10-CM | POA: Diagnosis not present

## 2019-01-12 DIAGNOSIS — I2510 Atherosclerotic heart disease of native coronary artery without angina pectoris: Secondary | ICD-10-CM | POA: Insufficient documentation

## 2019-01-12 DIAGNOSIS — M6281 Muscle weakness (generalized): Secondary | ICD-10-CM | POA: Diagnosis not present

## 2019-01-12 DIAGNOSIS — E119 Type 2 diabetes mellitus without complications: Secondary | ICD-10-CM | POA: Diagnosis not present

## 2019-01-12 DIAGNOSIS — I119 Hypertensive heart disease without heart failure: Secondary | ICD-10-CM | POA: Insufficient documentation

## 2019-01-12 DIAGNOSIS — E871 Hypo-osmolality and hyponatremia: Secondary | ICD-10-CM | POA: Diagnosis not present

## 2019-01-12 DIAGNOSIS — E785 Hyperlipidemia, unspecified: Secondary | ICD-10-CM | POA: Insufficient documentation

## 2019-01-12 DIAGNOSIS — I639 Cerebral infarction, unspecified: Secondary | ICD-10-CM | POA: Diagnosis present

## 2019-01-12 DIAGNOSIS — G459 Transient cerebral ischemic attack, unspecified: Secondary | ICD-10-CM

## 2019-01-12 DIAGNOSIS — I059 Rheumatic mitral valve disease, unspecified: Secondary | ICD-10-CM | POA: Insufficient documentation

## 2019-01-12 DIAGNOSIS — R2681 Unsteadiness on feet: Secondary | ICD-10-CM | POA: Diagnosis not present

## 2019-01-12 DIAGNOSIS — Z20828 Contact with and (suspected) exposure to other viral communicable diseases: Secondary | ICD-10-CM | POA: Insufficient documentation

## 2019-01-12 DIAGNOSIS — E118 Type 2 diabetes mellitus with unspecified complications: Secondary | ICD-10-CM | POA: Diagnosis not present

## 2019-01-12 DIAGNOSIS — Z7984 Long term (current) use of oral hypoglycemic drugs: Secondary | ICD-10-CM | POA: Insufficient documentation

## 2019-01-12 DIAGNOSIS — Z880 Allergy status to penicillin: Secondary | ICD-10-CM | POA: Diagnosis not present

## 2019-01-12 DIAGNOSIS — Z833 Family history of diabetes mellitus: Secondary | ICD-10-CM | POA: Diagnosis not present

## 2019-01-12 DIAGNOSIS — R29898 Other symptoms and signs involving the musculoskeletal system: Secondary | ICD-10-CM | POA: Diagnosis not present

## 2019-01-12 LAB — COMPREHENSIVE METABOLIC PANEL
ALT: 16 U/L (ref 0–44)
AST: 22 U/L (ref 15–41)
Albumin: 3.9 g/dL (ref 3.5–5.0)
Alkaline Phosphatase: 88 U/L (ref 38–126)
Anion gap: 11 (ref 5–15)
BUN: 27 mg/dL — ABNORMAL HIGH (ref 8–23)
CO2: 28 mmol/L (ref 22–32)
Calcium: 10.4 mg/dL — ABNORMAL HIGH (ref 8.9–10.3)
Chloride: 93 mmol/L — ABNORMAL LOW (ref 98–111)
Creatinine, Ser: 0.98 mg/dL (ref 0.44–1.00)
GFR calc Af Amer: >60 mL/min (ref >60)
GFR calc non Af Amer: 58 mL/min — ABNORMAL LOW (ref >60)
Glucose, Bld: 148 mg/dL — ABNORMAL HIGH (ref 70–99)
Potassium: 4.6 mmol/L (ref 3.5–5.1)
Sodium: 132 mmol/L — ABNORMAL LOW (ref 135–145)
Total Bilirubin: 0.7 mg/dL (ref 0.3–1.2)
Total Protein: 7.8 g/dL (ref 6.5–8.1)

## 2019-01-12 LAB — CBC
HCT: 41.9 % (ref 36.0–46.0)
Hemoglobin: 13.5 g/dL (ref 12.0–15.0)
MCH: 26.2 pg (ref 26.0–34.0)
MCHC: 32.2 g/dL (ref 30.0–36.0)
MCV: 81.2 fL (ref 80.0–100.0)
Platelets: 361 10*3/uL (ref 150–400)
RBC: 5.16 MIL/uL — ABNORMAL HIGH (ref 3.87–5.11)
RDW: 16.8 % — ABNORMAL HIGH (ref 11.5–15.5)
WBC: 7.4 10*3/uL (ref 4.0–10.5)
nRBC: 0 % (ref 0.0–0.2)

## 2019-01-12 MED ORDER — SODIUM CHLORIDE 0.9% FLUSH: 3.0000 mL | Freq: Once | INTRAVENOUS | Status: DC

## 2019-01-12 NOTE — ED Triage Notes (Signed)
Pt with complaints of "brain fog" intermittently and trouble using her right side onset yesterday at 3pm. States when this happens she feels like she is in a dream and her right side is moving in slow motion. No weakness/drift noted. No facial droop.

## 2019-01-13 ENCOUNTER — Observation Stay (HOSPITAL_COMMUNITY): Payer: Medicare Other

## 2019-01-13 ENCOUNTER — Observation Stay (HOSPITAL_BASED_OUTPATIENT_CLINIC_OR_DEPARTMENT_OTHER): Payer: Medicare Other

## 2019-01-13 ENCOUNTER — Encounter (HOSPITAL_COMMUNITY): Payer: Self-pay | Admitting: Family Medicine

## 2019-01-13 DIAGNOSIS — I340 Nonrheumatic mitral (valve) insufficiency: Secondary | ICD-10-CM

## 2019-01-13 DIAGNOSIS — I10 Essential (primary) hypertension: Secondary | ICD-10-CM

## 2019-01-13 DIAGNOSIS — E871 Hypo-osmolality and hyponatremia: Secondary | ICD-10-CM

## 2019-01-13 DIAGNOSIS — I639 Cerebral infarction, unspecified: Secondary | ICD-10-CM | POA: Diagnosis not present

## 2019-01-13 DIAGNOSIS — E119 Type 2 diabetes mellitus without complications: Secondary | ICD-10-CM | POA: Diagnosis not present

## 2019-01-13 DIAGNOSIS — I6389 Other cerebral infarction: Secondary | ICD-10-CM | POA: Diagnosis not present

## 2019-01-13 DIAGNOSIS — I6523 Occlusion and stenosis of bilateral carotid arteries: Secondary | ICD-10-CM | POA: Diagnosis not present

## 2019-01-13 DIAGNOSIS — M47812 Spondylosis without myelopathy or radiculopathy, cervical region: Secondary | ICD-10-CM | POA: Diagnosis not present

## 2019-01-13 DIAGNOSIS — R9082 White matter disease, unspecified: Secondary | ICD-10-CM | POA: Diagnosis not present

## 2019-01-13 LAB — DIFFERENTIAL
Abs Immature Granulocytes: 0.05 10*3/uL (ref 0.00–0.07)
Basophils Absolute: 0.1 10*3/uL (ref 0.0–0.1)
Basophils Relative: 1 %
Eosinophils Absolute: 0.3 10*3/uL (ref 0.0–0.5)
Eosinophils Relative: 4 %
Immature Granulocytes: 1 %
Lymphocytes Relative: 30 %
Lymphs Abs: 2.3 10*3/uL (ref 0.7–4.0)
Monocytes Absolute: 0.8 10*3/uL (ref 0.1–1.0)
Monocytes Relative: 10 %
Neutro Abs: 4.4 10*3/uL (ref 1.7–7.7)
Neutrophils Relative %: 54 %

## 2019-01-13 LAB — ECHOCARDIOGRAM COMPLETE
Height: 61 in
Weight: 2292.78 oz

## 2019-01-13 LAB — RAPID URINE DRUG SCREEN, HOSP PERFORMED
Amphetamines: NOT DETECTED
Barbiturates: NOT DETECTED
Benzodiazepines: POSITIVE — AB
Cocaine: NOT DETECTED
Opiates: NOT DETECTED
Tetrahydrocannabinol: NOT DETECTED

## 2019-01-13 LAB — GLUCOSE, CAPILLARY
Glucose-Capillary: 173 mg/dL — ABNORMAL HIGH (ref 70–99)
Glucose-Capillary: 124 mg/dL — ABNORMAL HIGH (ref 70–99)
Glucose-Capillary: 161 mg/dL — ABNORMAL HIGH (ref 70–99)

## 2019-01-13 LAB — CBC
HCT: 43.9 % (ref 36.0–46.0)
Hemoglobin: 13.9 g/dL (ref 12.0–15.0)
MCH: 25.7 pg — ABNORMAL LOW (ref 26.0–34.0)
MCHC: 31.7 g/dL (ref 30.0–36.0)
MCV: 81.1 fL (ref 80.0–100.0)
Platelets: 356 10*3/uL (ref 150–400)
RBC: 5.41 MIL/uL — ABNORMAL HIGH (ref 3.87–5.11)
RDW: 17.1 % — ABNORMAL HIGH (ref 11.5–15.5)
WBC: 7.8 10*3/uL (ref 4.0–10.5)
nRBC: 0 % (ref 0.0–0.2)

## 2019-01-13 LAB — URINALYSIS, ROUTINE W REFLEX MICROSCOPIC
Bacteria, UA: NONE SEEN
Bilirubin Urine: NEGATIVE
Glucose, UA: >=500 mg/dL — AB
Hgb urine dipstick: NEGATIVE
Ketones, ur: NEGATIVE mg/dL
Leukocytes,Ua: NEGATIVE
Nitrite: NEGATIVE
Protein, ur: NEGATIVE mg/dL
Specific Gravity, Urine: 1.007 (ref 1.005–1.030)
pH: 6 (ref 5.0–8.0)

## 2019-01-13 LAB — APTT: aPTT: 29 seconds (ref 24–36)

## 2019-01-13 LAB — LIPID PANEL
Cholesterol: 213 mg/dL — ABNORMAL HIGH (ref 0–200)
HDL: 66 mg/dL (ref >40)
LDL Cholesterol: 129 mg/dL — ABNORMAL HIGH (ref 0–99)
Total CHOL/HDL Ratio: 3.2 RATIO
Triglycerides: 89 mg/dL (ref <150)
VLDL: 18 mg/dL (ref 0–40)

## 2019-01-13 LAB — PROTIME-INR
INR: 1 (ref 0.8–1.2)
Prothrombin Time: 12.7 seconds (ref 11.4–15.2)

## 2019-01-13 LAB — SARS CORONAVIRUS 2 (TAT 6-24 HRS): SARS Coronavirus 2: NEGATIVE

## 2019-01-13 LAB — ETHANOL: Alcohol, Ethyl (B): <10 mg/dL (ref <10)

## 2019-01-13 LAB — HEMOGLOBIN A1C
Hgb A1c MFr Bld: 7.4 % — ABNORMAL HIGH (ref 4.8–5.6)
Mean Plasma Glucose: 165.68 mg/dL

## 2019-01-13 MED ORDER — CLOPIDOGREL BISULFATE 75 MG PO TABS: 75.0000 mg | ORAL_TABLET | Freq: Every day | ORAL | 0 refills | Status: DC

## 2019-01-13 MED ORDER — CLOPIDOGREL BISULFATE 75 MG PO TABS
75.0000 mg | ORAL_TABLET | Freq: Every day | ORAL | Status: DC
  Administered 2019-01-13: 75 mg via ORAL
  Filled 2019-01-13: qty 1

## 2019-01-13 MED ORDER — ATORVASTATIN CALCIUM 40 MG PO TABS: 40.0000 mg | ORAL_TABLET | Freq: Every day | ORAL | 0 refills | Status: DC

## 2019-01-13 MED ORDER — ASPIRIN EC 81 MG PO TBEC
81.0000 mg | DELAYED_RELEASE_TABLET | Freq: Every day | ORAL | Status: DC
  Administered 2019-01-13: 81 mg via ORAL
  Filled 2019-01-13: qty 1

## 2019-01-13 MED ORDER — STROKE: EARLY STAGES OF RECOVERY BOOK
Freq: Once | Status: AC
  Administered 2019-01-13: 11:00:00
  Filled 2019-01-13: qty 1

## 2019-01-13 MED ORDER — INSULIN ASPART 100 UNIT/ML ~~LOC~~ SOLN: 0.0000 [IU] | Freq: Every day | SUBCUTANEOUS | Status: DC

## 2019-01-13 MED ORDER — ENOXAPARIN SODIUM 40 MG/0.4ML ~~LOC~~ SOLN
40.0000 mg | SUBCUTANEOUS | Status: DC
  Administered 2019-01-13: 40 mg via SUBCUTANEOUS
  Filled 2019-01-13: qty 0.4

## 2019-01-13 MED ORDER — IOHEXOL 350 MG/ML SOLN
100.0000 mL | Freq: Once | INTRAVENOUS | Status: AC | PRN
  Administered 2019-01-13: 100 mL via INTRAVENOUS

## 2019-01-13 MED ORDER — ASPIRIN 81 MG PO TBEC: 81.0000 mg | DELAYED_RELEASE_TABLET | Freq: Every day | ORAL | 0 refills | Status: AC

## 2019-01-13 MED ORDER — ACETAMINOPHEN 650 MG RE SUPP: 650.0000 mg | RECTAL | Status: DC | PRN

## 2019-01-13 MED ORDER — SENNOSIDES-DOCUSATE SODIUM 8.6-50 MG PO TABS: 1.0000 | ORAL_TABLET | Freq: Every evening | ORAL | Status: DC | PRN

## 2019-01-13 MED ORDER — ACETAMINOPHEN 160 MG/5ML PO SOLN: 650.0000 mg | ORAL | Status: DC | PRN

## 2019-01-13 MED ORDER — ACETAMINOPHEN 325 MG PO TABS: 650.0000 mg | ORAL_TABLET | ORAL | Status: DC | PRN

## 2019-01-13 MED ORDER — SODIUM CHLORIDE 0.9 % IV SOLN
INTRAVENOUS | Status: AC
  Administered 2019-01-13: 07:00:00 via INTRAVENOUS

## 2019-01-13 MED ORDER — ATORVASTATIN CALCIUM 40 MG PO TABS
40.0000 mg | ORAL_TABLET | Freq: Every day | ORAL | Status: DC
  Administered 2019-01-13: 40 mg via ORAL
  Filled 2019-01-13: qty 1

## 2019-01-13 MED ORDER — INSULIN ASPART 100 UNIT/ML ~~LOC~~ SOLN
0.0000 [IU] | Freq: Three times a day (TID) | SUBCUTANEOUS | Status: DC
  Administered 2019-01-13: 2 [IU] via SUBCUTANEOUS
  Administered 2019-01-13: 1 [IU] via SUBCUTANEOUS

## 2019-01-13 NOTE — Discharge Planning (Signed)
Nsg Discharge Note  Admit Date:  01/12/2019 Discharge date: 01/13/2019   Carrie [name] to be D/C'd Home per MD order.  AVS completed.    Discharge Medication: Allergies as of 01/13/2019      Reactions   Penicillins Hives      Medication List    TAKE these medications   amLODipine 10 MG tablet Commonly known as: NORVASC Take 5 mg by mouth daily.   aspirin 81 MG EC tablet Take 1 tablet (81 mg total) by mouth daily. Start taking on: January 14, 2019   atorvastatin 40 MG tablet Commonly known as: LIPITOR Take 1 tablet (40 mg total) by mouth daily at 6 PM.   cloNIDine 0.2 MG tablet Commonly known as: Catapres Take 1 tablet (0.2 mg total) by mouth 2 (two) times daily.   clopidogrel 75 MG tablet Commonly known as: PLAVIX Take 1 tablet (75 mg total) by mouth daily for 21 days. Start taking on: January 14, 2019   diazepam 5 MG tablet Commonly known as: VALIUM Take 5 mg by mouth every 6 (six) hours as needed for anxiety.   empagliflozin 25 MG Tabs tablet Commonly known as: JARDIANCE Take 25 mg by mouth daily.   esomeprazole 40 MG capsule Commonly known as: NEXIUM Take 40 mg by mouth daily as needed (for acid reflux).   Fusion Plus Caps Take 1 capsule by mouth daily.   glimepiride 1 MG tablet Commonly known as: AMARYL Take 1 mg by mouth daily.   hydrochlorothiazide 25 MG tablet Commonly known as: HYDRODIURIL Take 25 mg by mouth daily.   metoprolol succinate 100 MG 24 hr tablet Commonly known as: TOPROL-XL Take 100 mg by mouth daily.   sitaGLIPtin-metformin 50-1000 MG tablet Commonly known as: JANUMET Take 1 tablet by mouth 2 (two) times daily with a meal.   spironolactone 25 MG tablet Commonly known as: ALDACTONE Take 25 mg by mouth daily.   telmisartan 40 MG tablet Commonly known as: MICARDIS Take 20 mg by mouth daily.       Discharge Assessment: Vitals:   01/13/19 1424 01/13/19 1623  BP: (!) 173/87 (!) 187/79  Pulse: 75 76  Resp: 17 16   Temp: 98.5 F (36.9 C) 98.9 F (37.2 C)  SpO2: 100% 95%   Skin clean, dry and intact without evidence of skin break down, no evidence of skin tears noted. IV catheter discontinued intact. Site without signs and symptoms of complications - no redness or edema noted at insertion site, patient denies c/o pain - only slight tenderness at site.  Dressing with slight pressure applied.  D/c Instructions-Education: Discharge instructions given to patient/family with verbalized understanding. D/c education completed with patient/family including follow up instructions, medication list, d/c activities limitations if indicated, with other d/c instructions as indicated by MD - patient able to verbalize understanding, all questions fully answered. Patient instructed to return to ED, call 911, or call MD for any changes in condition.  Patient escorted via [place], and D/C home via private auto.  Hiram Comber, RN 01/13/2019 5:39 PM

## 2019-01-13 NOTE — ED Provider Notes (Signed)
[place] [facility] Provider Note  CSN: [id number] Arrival date & time: 01/12/19 1517  Chief Complaint(s) Transient Ischemic Attack  HPI Carrie [name] is a 73 y.o. female with a past medical history listed below including hypertension, hyperlipidemia, diabetes who presents to the emergency department for symptoms concerning for TIA.  Carrie [name] reports that Carrie [name] had right sided jaw weakness and dysarthria noted 2 days ago (Thurs 7 pm.) which lasted 40 min. Friday morning (2 am) had right LE and UE weakness that lasted 30 min.  Carrie [name] saw PCP earlier today for this who got a CT noted likley subacute ischemia. PCP notified and asked Carrie [name] to come to the ED.   Currently has no symptoms.  HPI  Past Medical History Past Medical History:  Diagnosis Date  . Anemia   . Diabetes mellitus   . DM (diabetes mellitus) ([place]) 10/19/2018  . HLD (hyperlipidemia) 10/19/2018  . HTN (hypertension) 10/19/2018  . Hypertension   . Palpitations 10/19/2018   Carrie [name] Active Problem List   Diagnosis Date Noted  . Ischemic stroke ([place]) 01/13/2019  . Hyponatremia 01/13/2019  . Diabetes mellitus type II, non insulin dependent ([place]) 10/19/2018  . HTN (hypertension) 10/19/2018  . HLD (hyperlipidemia) 10/19/2018  . Palpitations 10/19/2018   Home Medication(s) Prior to Admission medications   Medication Sig Start Date End Date Taking? Authorizing Provider  amLODipine (NORVASC) 10 MG tablet Take 5 mg by mouth daily.    Yes [provider]  cloNIDine (CATAPRES) 0.2 MG tablet Take 1 tablet (0.2 mg total) by mouth 2 (two) times daily. 08/01/18  Yes Adrian Prows, MD  diazepam (VALIUM) 5 MG tablet Take 5 mg by mouth every 6 (six) hours as needed for anxiety.    Yes [provider]  empagliflozin (JARDIANCE) 25 MG TABS tablet Take 25 mg by mouth daily.   Yes [provider]  esomeprazole (NEXIUM) 40 MG capsule Take 40 mg by mouth daily as needed (for acid reflux).    Yes [provider]  glimepiride (AMARYL) 1 MG tablet Take 1 mg by mouth daily. 10/23/18  Yes [provider]  hydrochlorothiazide (HYDRODIURIL) 25 MG tablet Take 25 mg by mouth daily.   Yes [provider]  Iron-FA-B Cmp-C-Biot-Probiotic (FUSION PLUS) CAPS Take 1 capsule by mouth daily. 12/13/18  Yes [provider]  metoprolol (TOPROL-XL) 100 MG 24 hr tablet Take 100 mg by mouth daily.     Yes [provider]  sitaGLIPtin-metformin (JANUMET) 50-1000 MG tablet Take 1 tablet by mouth 2 (two) times daily with a meal.   Yes [provider]  spironolactone (ALDACTONE) 25 MG tablet Take 25 mg by mouth daily.   Yes [provider]  telmisartan (MICARDIS) 40 MG tablet Take 20 mg by mouth daily.    Yes [provider]  Past Surgical History Past Surgical History:  Procedure Laterality Date  . ABDOMINAL HYSTERECTOMY     1993  . CESAREAN SECTION     Family History Family History  Problem Relation Age of Onset  . Hypertension Father   . Diabetes Mother   . Hypertension Mother   . Diabetes Brother   . Diabetes Sister     Social History Social History   Tobacco Use  . Smoking status: Never Smoker  . Smokeless tobacco: Never Used  Substance Use Topics  . Alcohol use: No  . Drug use: No   Allergies Penicillins  Review of Systems Review of Systems All other systems are reviewed and are negative for acute change except as noted in the HPI  Physical Exam Vital Signs  I have reviewed the triage vital signs BP (!) 170/84 (BP Location: Left Arm)   Pulse 60   Temp 98.2 F (36.8 C) (Oral)   Resp 18   SpO2 100%   Physical Exam Vitals signs reviewed.  Constitutional:      General: Carrie [name] is not in acute distress.    Appearance: Carrie [name] is well-developed. Carrie [name] is not diaphoretic.  HENT:     Head:  Normocephalic and atraumatic.     Nose: Nose normal.  Eyes:     General: No scleral icterus.       Right eye: No discharge.        Left eye: No discharge.     Conjunctiva/sclera: Conjunctivae normal.     Pupils: Pupils are equal, round, and reactive to light.  Neck:     Musculoskeletal: Normal range of motion and neck supple.  Cardiovascular:     Rate and Rhythm: Normal rate and regular rhythm.     Heart sounds: No murmur. No friction rub. No gallop.   Pulmonary:     Effort: Pulmonary effort is normal. No respiratory distress.     Breath sounds: Normal breath sounds. No stridor. No rales.  Abdominal:     General: There is no distension.     Palpations: Abdomen is soft.     Tenderness: There is no abdominal tenderness.  Musculoskeletal:        General: No tenderness.  Skin:    General: Skin is warm and dry.     Findings: No erythema or rash.  Neurological:     Mental Status: Carrie [name] is alert and oriented to person, place, and time.     Comments: Mental Status:  Alert and oriented to person, place, and time.  Attention and concentration normal.  Speech clear.  Recent memory is intact  Cranial Nerves:  II Visual Fields: Intact to confrontation. Visual fields intact. III, IV, VI: Pupils equal and reactive to light and near. Full eye movement without nystagmus  V Facial Sensation: Normal. No weakness of masticatory muscles  VII: No facial weakness or asymmetry  VIII Auditory Acuity: Grossly normal  IX/X: The uvula is midline; the palate elevates symmetrically  XI: Normal sternocleidomastoid and trapezius strength  XII: The tongue is midline. No atrophy or fasciculations.   Motor System: Muscle Strength: 5/5 and symmetric in the upper and lower extremities. No pronation or drift.  Muscle Tone: Tone and muscle bulk are normal in the upper and lower extremities.   Reflexes: DTRs: 1+ and symmetrical in all four extremities. No Clonus Coordination: Intact finger-to-nose, heel-to-shin.  No tremor.  Sensation: Intact to light touch, and pinprick. Negative Romberg test.  Gait: Routine  gait normal.  ED Results and Treatments Labs (all labs ordered are listed, but only abnormal results are displayed) Labs Reviewed  COMPREHENSIVE METABOLIC PANEL - Abnormal; Notable for the following components:      Result Value   Sodium 132 (*)    Chloride 93 (*)    Glucose, Bld 148 (*)    BUN 27 (*)    Calcium 10.4 (*)    GFR calc non Af Amer 58 (*)    All other components within normal limits  CBC - Abnormal; Notable for the following components:   RBC 5.16 (*)    RDW 16.8 (*)    All other components within normal limits  RAPID URINE DRUG SCREEN, HOSP PERFORMED - Abnormal; Notable for the following components:   Benzodiazepines POSITIVE (*)    All other components within normal limits  URINALYSIS, ROUTINE W REFLEX MICROSCOPIC - Abnormal; Notable for the following components:   Color, Urine STRAW (*)    Glucose, UA >=500 (*)    All other components within normal limits  CBC - Abnormal; Notable for the following components:   RBC 5.41 (*)    MCH 25.7 (*)    RDW 17.1 (*)    All other components within normal limits  GLUCOSE, CAPILLARY - Abnormal; Notable for the following components:   Glucose-Capillary 173 (*)    All other components within normal limits  HEMOGLOBIN A1C - Abnormal; Notable for the following components:   Hgb A1c MFr Bld 7.4 (*)    All other components within normal limits  LIPID PANEL - Abnormal; Notable for the following components:   Cholesterol 213 (*)    LDL Cholesterol 129 (*)    All other components within normal limits  SARS CORONAVIRUS 2 (TAT 6-24 HRS)  ETHANOL  PROTIME-INR  APTT  DIFFERENTIAL  CBG MONITORING, ED                                                                                                                         EKG  EKG Interpretation  Date/Time:    Ventricular Rate:    PR Interval:    QRS Duration:   QT  Interval:    QTC Calculation:   R Axis:     Text Interpretation:        Radiology Ct Angio Head W Or Wo Contrast  Result Date: 01/13/2019 CLINICAL DATA:  Stroke follow-up EXAM: CT ANGIOGRAPHY HEAD AND NECK TECHNIQUE: Multidetector CT imaging of the head and neck was performed using the standard protocol during bolus administration of intravenous contrast. Multiplanar CT image reconstructions and MIPs were obtained to evaluate the vascular anatomy. Carotid stenosis measurements (when applicable) are obtained utilizing NASCET criteria, using the distal internal carotid diameter as the denominator. CONTRAST:  183mL OMNIPAQUE IOHEXOL 350 MG/ML SOLN COMPARISON:  None. FINDINGS: CTA NECK FINDINGS SKELETON: There is no bony spinal canal stenosis. No lytic or blastic lesion. OTHER NECK: Normal pharynx, larynx and major salivary glands. No cervical lymphadenopathy. Unremarkable thyroid gland.  UPPER CHEST: No pneumothorax or pleural effusion. No nodules or masses. AORTIC ARCH: There is mild calcific atherosclerosis of the aortic arch. There is no aneurysm, dissection or hemodynamically significant stenosis of the visualized portion of the aorta. Conventional 3 vessel aortic branching pattern. The visualized proximal subclavian arteries are widely patent. RIGHT CAROTID SYSTEM: Normal without aneurysm, dissection or stenosis. LEFT CAROTID SYSTEM: Normal without aneurysm, dissection or stenosis. VERTEBRAL ARTERIES: Codominant configuration. Both origins are clearly patent. There is no dissection, occlusion or flow-limiting stenosis to the skull base (V1-V3 segments). CTA HEAD FINDINGS POSTERIOR CIRCULATION: --Vertebral arteries: Normal V4 segments. --Posterior inferior cerebellar arteries (PICA): Patent origins from the vertebral arteries. --Anterior inferior cerebellar arteries (AICA): Patent origins from the basilar artery. --Basilar artery: Normal. --Superior cerebellar arteries: Normal. --Posterior cerebral  arteries: Normal. Both originate from the basilar artery. Posterior communicating arteries (p-comm) are diminutive or absent. ANTERIOR CIRCULATION: --Intracranial internal carotid arteries: Atherosclerotic calcification of the internal carotid arteries at the skull base without hemodynamically significant stenosis. --Anterior cerebral arteries (ACA): Normal. Both A1 segments are present. Patent anterior communicating artery (a-comm). --Middle cerebral arteries (MCA): Normal. VENOUS SINUSES: As permitted by contrast timing, patent. ANATOMIC VARIANTS: None Review of the MIP images confirms the above findings. IMPRESSION: 1. No emergent large vessel occlusion or high-grade stenosis of the intracranial arteries. 2. Aortic Atherosclerosis (ICD10-I70.0). Electronically Signed   By: Ulyses Jarred M.D.   On: 01/13/2019 05:16   Ct Head Wo Contrast  Result Date: 01/12/2019 CLINICAL DATA:  Right head numbness and weakness for 30 minutes with garbled speech yesterday at 10:30 a.m. Altered mental status since. EXAM: CT HEAD WITHOUT CONTRAST TECHNIQUE: Contiguous axial images were obtained from the base of the skull through the vertex without intravenous contrast. COMPARISON:  None. FINDINGS: Brain: Minimally enlarged ventricles and subarachnoid spaces. Mild patchy white matter low density in both cerebral hemispheres. Ill defined low density in both occipital lobes, involving the gray and white matter. No mass effect or intracranial hemorrhage. Vascular: No hyperdense vessel or unexpected calcification. Skull: Mild bilateral hyperostosis frontalis. Sinuses/Orbits: Unremarkable. Other: None. IMPRESSION: 1. Ill defined low density in both occipital lobes, involving the gray and white matter, suspicious for subacute infarcts. Further evaluation with brain MRI is recommended. 2. Minimal diffuse cerebral and cerebellar atrophy. 3. Mild chronic small vessel white matter ischemic changes in both cerebral hemispheres. These results  will be called to the ordering clinician or representative by the Radiologist Assistant, and communication documented in the PACS or zVision Dashboard. Electronically Signed   By: Claudie Revering M.D.   On: 01/12/2019 13:03   Ct Angio Neck W Or Wo Contrast  Result Date: 01/13/2019 CLINICAL DATA:  Stroke follow-up EXAM: CT ANGIOGRAPHY HEAD AND NECK TECHNIQUE: Multidetector CT imaging of the head and neck was performed using the standard protocol during bolus administration of intravenous contrast. Multiplanar CT image reconstructions and MIPs were obtained to evaluate the vascular anatomy. Carotid stenosis measurements (when applicable) are obtained utilizing NASCET criteria, using the distal internal carotid diameter as the denominator. CONTRAST:  136mL OMNIPAQUE IOHEXOL 350 MG/ML SOLN COMPARISON:  None. FINDINGS: CTA NECK FINDINGS SKELETON: There is no bony spinal canal stenosis. No lytic or blastic lesion. OTHER NECK: Normal pharynx, larynx and major salivary glands. No cervical lymphadenopathy. Unremarkable thyroid gland. UPPER CHEST: No pneumothorax or pleural effusion. No nodules or masses. AORTIC ARCH: There is mild calcific atherosclerosis of the aortic arch. There is no aneurysm, dissection or hemodynamically significant stenosis of the visualized portion of the aorta.  Conventional 3 vessel aortic branching pattern. The visualized proximal subclavian arteries are widely patent. RIGHT CAROTID SYSTEM: Normal without aneurysm, dissection or stenosis. LEFT CAROTID SYSTEM: Normal without aneurysm, dissection or stenosis. VERTEBRAL ARTERIES: Codominant configuration. Both origins are clearly patent. There is no dissection, occlusion or flow-limiting stenosis to the skull base (V1-V3 segments). CTA HEAD FINDINGS POSTERIOR CIRCULATION: --Vertebral arteries: Normal V4 segments. --Posterior inferior cerebellar arteries (PICA): Patent origins from the vertebral arteries. --Anterior inferior cerebellar arteries  (AICA): Patent origins from the basilar artery. --Basilar artery: Normal. --Superior cerebellar arteries: Normal. --Posterior cerebral arteries: Normal. Both originate from the basilar artery. Posterior communicating arteries (p-comm) are diminutive or absent. ANTERIOR CIRCULATION: --Intracranial internal carotid arteries: Atherosclerotic calcification of the internal carotid arteries at the skull base without hemodynamically significant stenosis. --Anterior cerebral arteries (ACA): Normal. Both A1 segments are present. Patent anterior communicating artery (a-comm). --Middle cerebral arteries (MCA): Normal. VENOUS SINUSES: As permitted by contrast timing, patent. ANATOMIC VARIANTS: None Review of the MIP images confirms the above findings. IMPRESSION: 1. No emergent large vessel occlusion or high-grade stenosis of the intracranial arteries. 2. Aortic Atherosclerosis (ICD10-I70.0). Electronically Signed   By: Ulyses Jarred M.D.   On: 01/13/2019 05:16   Mr Brain Wo Contrast  Result Date: 01/13/2019 CLINICAL DATA:  TIA, initial exam. Right-sided weakness 2 days ago. EXAM: MRI HEAD WITHOUT CONTRAST TECHNIQUE: Multiplanar, multiecho pulse sequences of the brain and surrounding structures were obtained without intravenous contrast. COMPARISON:  CT head without contrast 01/12/2019. CTA head and neck 01/13/2019 FINDINGS: Brain: The diffusion-weighted images demonstrate acute/subacute infarct involving the posterior aspect of the left lentiform nucleus. The internal capsule is spared. A 14 mm acute nonhemorrhagic infarct is also noted at the left occipital pole. There is subtle T2 signal associated with both regions. Confluent periventricular and scattered subcortical T2 hyperintensities are otherwise mildly advanced for age. Mild generalized atrophy is noted. Ventricles are of normal size. No significant extra-axial fluid collection present. No acute hemorrhage or mass lesion is present. The brainstem and cerebellum  are within normal limits. Vascular: Flow is present in the major intracranial arteries. Skull and upper cervical spine: Mild degenerative changes are noted in the cervical spine at C5-6. Apart from endplate changes, marrow signal is normal. The craniocervical junction is normal. Midline structures are otherwise unremarkable. Sinuses/Orbits: The paranasal sinuses and mastoid air cells are clear. The globes and orbits are within normal limits. IMPRESSION: 1. Acute/subacute nonhemorrhagic infarcts involving the posterior left lentiform nucleus and left occipital pole. 2. Atrophy and white matter disease is mildly advanced for age. This likely reflects the sequela of chronic microvascular ischemia. Electronically Signed   By: San Morelle M.D.   On: 01/13/2019 07:37    Pertinent labs & imaging results that were available during my care of the Carrie [name] were reviewed by me and considered in my medical decision making (see chart for details).  Medications Ordered in ED Medications  sodium chloride flush (NS) 0.9 % injection 3 mL (has no administration in time range)   stroke: mapping our early stages of recovery book (has no administration in time range)  0.9 %  sodium chloride infusion ( Intravenous New Bag/Given 01/13/19 0718)  acetaminophen (TYLENOL) tablet 650 mg (has no administration in time range)    Or  acetaminophen (TYLENOL) 160 MG/5ML solution 650 mg (has no administration in time range)    Or  acetaminophen (TYLENOL) suppository 650 mg (has no administration in time range)  senna-docusate (Senokot-S) tablet 1 tablet (has no administration in  time range)  enoxaparin (LOVENOX) injection 40 mg (has no administration in time range)  insulin aspart (novoLOG) injection 0-9 Units (has no administration in time range)  insulin aspart (novoLOG) injection 0-5 Units (has no administration in time range)  iohexol (OMNIPAQUE) 350 MG/ML injection 100 mL (100 mLs Intravenous Contrast Given 01/13/19  0506)                                                                                                                                    Procedures Procedures  (including critical care time)  Medical Decision Making / ED Course I have reviewed the nursing notes for this encounter and the Carrie [name]'s prior records (if available in EHR or on provided paperwork).   ANAYI BARRETT was evaluated in Emergency Department on 01/13/2019 for the symptoms described in the history of present illness. Carrie [name] was evaluated in the context of the global COVID-19 pandemic, which necessitated consideration that the Carrie [name] might be at risk for infection with the SARS-CoV-2 virus that causes COVID-19. Institutional protocols and algorithms that pertain to the evaluation of patients at risk for COVID-19 are in a state of rapid change based on information released by regulatory bodies including the CDC and federal and state organizations. These policies and algorithms were followed during the Carrie [name]'s care in the ED.  Presentation is concerning for TIAs.  Given CT findings suspicious for subacute stroke, Carrie [name] was admitted for TIA work-up including MRI.  Dr. Lorraine Lax from neurology consulted.  Admitted to hospitalist service.      Final Clinical Impression(s) / ED Diagnoses Final diagnoses:  TIA (transient ischemic attack)      This chart was dictated using voice recognition software.  Despite best efforts to proofread,  errors can occur which can change the documentation meaning.   Fatima Blank, MD 01/13/19 [phone]

## 2019-01-13 NOTE — H&P (Signed)
History and Physical    Carrie [name] [id number] DOB: March 25, 1945 DOA: 01/12/2019  PCP: Jani Gravel, MD   Patient coming from: Home   Chief Complaint: Right-sided weakness  HPI: Carrie [name] is a 73 y.o. female with medical history significant for resistant hypertension and type 2 diabetes mellitus, now presenting to the emergency department with right-sided neurologic deficits.  Patient reports that she had been in her usual state of health until 01/11/2019 when she developed right facial weakness for she later was experiencing right upper and lower extremity weakness and a sensation as though she was dreaming.  She rates these concerns with her PCP, CT head was obtained, there was concern for ischemic infarctions in the occipital lobe, and she was directed to the ED.  The patient denies any recent chest pain or palpitations, reports a history of palpitations, and wore an ambulatory heart monitor a year ago that demonstrated SVT, PACs, and suspected atrial tachycardia.  She denies recent fevers, chills, chest pain, shortness of breath, or cough.  No lower extremity swelling or tenderness.  ED Course: Upon arrival to the ED, patient is found to be afebrile, saturating well on room air, and with stable blood pressure.  Chemistry panel is notable for sodium of 132 and elevated BUN to creatinine ratio.  CBC is unremarkable.  CT head without contrast is notable for ill-defined low-density involving the bilateral occipital lobes suspicious for subacute infarction.  Neurology was consulted by the ED physician, Covid testing, EKG, and MRI were ordered.  Review of Systems:  All other systems reviewed and apart from HPI, are negative.  Past Medical History:  Diagnosis Date  . Anemia   . Diabetes mellitus   . DM (diabetes mellitus) ([place]) 10/19/2018  . HLD (hyperlipidemia) 10/19/2018  . HTN (hypertension) 10/19/2018  . Hypertension   . Palpitations 10/19/2018    Past Surgical History:  Procedure  Laterality Date  . ABDOMINAL HYSTERECTOMY     1993  . CESAREAN SECTION       reports that she has never smoked. She has never used smokeless tobacco. She reports that she does not drink alcohol or use drugs.  Allergies  Allergen Reactions  . Penicillins Hives    Family History  Problem Relation Age of Onset  . Hypertension Father   . Diabetes Mother   . Hypertension Mother   . Diabetes Brother   . Diabetes Sister      Prior to Admission medications   Medication Sig Start Date End Date Taking? Authorizing Provider  amLODipine (NORVASC) 10 MG tablet Take 10 mg by mouth daily.    [provider]  cloNIDine (CATAPRES) 0.2 MG tablet Take 1 tablet (0.2 mg total) by mouth 2 (two) times daily. 08/01/18   Adrian Prows, MD  diazepam (VALIUM) 5 MG tablet Take 5 mg by mouth every 6 (six) hours as needed.    [provider]  empagliflozin (JARDIANCE) 25 MG TABS tablet Take 25 mg by mouth daily.    [provider]  esomeprazole (NEXIUM) 40 MG capsule Take 40 mg by mouth daily as needed.      [provider]  hydrochlorothiazide (HYDRODIURIL) 25 MG tablet Take 25 mg by mouth daily.    [provider]  meloxicam (MOBIC) 15 MG tablet Take 15 mg by mouth daily as needed.    [provider]  metoprolol (TOPROL-XL) 100 MG 24 hr tablet Take 100 mg by mouth daily.      [provider]  sitaGLIPtin-metformin (JANUMET) 50-1000 MG tablet Take 1 tablet by mouth 2 (two) times daily with a meal.    [provider]  spironolactone (ALDACTONE) 25 MG tablet Take 25 mg by mouth daily.    [provider]  telmisartan (MICARDIS) 40 MG tablet Take 40 mg by mouth daily.    [provider]    Physical Exam: Vitals:   01/12/19 1535 01/12/19 1952 01/12/19 2231 01/13/19 0045  BP: 130/72 (!) 187/70 (!) 170/84 (!) 209/87  Pulse: 77 62 60 62  Resp: 18 20 18 16   Temp: 98.4 F (36.9 C) 98.2 F (36.8 C)    TempSrc: Oral Oral     SpO2: 99% 100% 100% 100%    Constitutional: NAD, calm  Eyes: PERTLA, lids and conjunctivae normal ENMT: Mucous membranes are moist. Posterior pharynx clear of any exudate or lesions.   Neck: normal, supple, no masses, no thyromegaly Respiratory: no wheezing, no crackles. Normal respiratory effort. No accessory muscle use.  Cardiovascular: S1 & S2 heard, regular rate and rhythm. No extremity edema.   Abdomen: No distension, no tenderness, soft. Bowel sounds normal.  Musculoskeletal: no clubbing / cyanosis. No joint deformity upper and lower extremities.    Skin: no significant rashes, lesions, ulcers. Warm, dry, well-perfused. Neurologic: CN 2-12 grossly intact. Sensation intact, patellar DTR intact. Strength 5/5 in all 4 limbs.  Psychiatric: Alert and oriented to person, place, and situation. Pleasant, cooperative.     Labs on Admission: I have personally reviewed following labs and imaging studies  CBC: Recent Labs  Lab 01/12/19 1535  WBC 7.4  HGB 13.5  HCT 41.9  MCV 81.2  PLT [id number]   Basic Metabolic Panel: Recent Labs  Lab 01/12/19 1535  NA 132*  K 4.6  CL 93*  CO2 28  GLUCOSE 148*  BUN 27*  CREATININE 0.98  CALCIUM 10.4*   GFR: CrCl cannot be calculated (Unknown ideal weight.). Liver Function Tests: Recent Labs  Lab 01/12/19 1535  AST 22  ALT 16  ALKPHOS 88  BILITOT 0.7  PROT 7.8  ALBUMIN 3.9   No results for input(s): LIPASE, AMYLASE in the last 168 hours. No results for input(s): AMMONIA in the last 168 hours. Coagulation Profile: No results for input(s): INR, PROTIME in the last 168 hours. Cardiac Enzymes: No results for input(s): CKTOTAL, CKMB, CKMBINDEX, TROPONINI in the last 168 hours. BNP (last 3 results) No results for input(s): PROBNP in the last 8760 hours. HbA1C: No results for input(s): HGBA1C in the last 72 hours. CBG: No results for input(s): GLUCAP in the last 168 hours. Lipid Profile: No results for input(s): CHOL, HDL, LDLCALC,  TRIG, CHOLHDL, LDLDIRECT in the last 72 hours. Thyroid Function Tests: No results for input(s): TSH, T4TOTAL, FREET4, T3FREE, THYROIDAB in the last 72 hours. Anemia Panel: No results for input(s): VITAMINB12, FOLATE, FERRITIN, TIBC, IRON, RETICCTPCT in the last 72 hours. Urine analysis:    Component Value Date/Time   COLORURINE YELLOW 07/24/2008 1332   APPEARANCEUR CLEAR 07/24/2008 1332   LABSPEC 1.002 (L) 07/24/2008 1332   PHURINE 7.0 07/24/2008 1332   GLUCOSEU NEGATIVE 07/24/2008 1332   HGBUR TRACE (A) 07/24/2008 1332   BILIRUBINUR NEGATIVE 07/24/2008 1332   KETONESUR NEGATIVE 07/24/2008 1332   PROTEINUR NEGATIVE 07/24/2008 1332   UROBILINOGEN 0.2 07/24/2008 1332   NITRITE NEGATIVE 07/24/2008 1332   LEUKOCYTESUR NEGATIVE 07/24/2008 1332   Sepsis Labs: @LABRCNTIP (procalcitonin:4,lacticidven:4) )No results found for this or any previous visit (from the past 240 hour(s)).   Radiological Exams  on Admission: Ct Head Wo Contrast  Result Date: 01/12/2019 CLINICAL DATA:  Right head numbness and weakness for 30 minutes with garbled speech yesterday at 10:30 a.m. Altered mental status since. EXAM: CT HEAD WITHOUT CONTRAST TECHNIQUE: Contiguous axial images were obtained from the base of the skull through the vertex without intravenous contrast. COMPARISON:  None. FINDINGS: Brain: Minimally enlarged ventricles and subarachnoid spaces. Mild patchy white matter low density in both cerebral hemispheres. Ill defined low density in both occipital lobes, involving the gray and white matter. No mass effect or intracranial hemorrhage. Vascular: No hyperdense vessel or unexpected calcification. Skull: Mild bilateral hyperostosis frontalis. Sinuses/Orbits: Unremarkable. Other: None. IMPRESSION: 1. Ill defined low density in both occipital lobes, involving the gray and white matter, suspicious for subacute infarcts. Further evaluation with brain MRI is recommended. 2. Minimal diffuse cerebral and cerebellar  atrophy. 3. Mild chronic small vessel white matter ischemic changes in both cerebral hemispheres. These results will be called to the ordering clinician or representative by the Radiologist Assistant, and communication documented in the PACS or zVision Dashboard. Electronically Signed   By: Claudie Revering M.D.   On: 01/12/2019 13:03    EKG: Ordered, not yet performed.   Assessment/Plan   1. Ischemic CVA  - Presents with intermittent right-sided deficits and dissociative type symptoms that began on 10/29 - CT head features low-density regions in bilateral occipital lobe suspicious for ischemic CVA  - Neurology is consulting and much appreciated  - Check EKG, MRI brain, CTA head and neck, echocardiogram, A1c, and fasting lipids  - Continue cardiac monitoring, frequent neuro checks, PT/OT/SLP evals    2. Type II DM  - No recent A1c  - Managed at home with Jardiance and Janumet, held on admission  - Update A1c, monitor CBG's, use low-intensity SSI with Novolog while in hospital    3. Hypertension  - Hold antihypertensives initially   4. Hyponatremia  - Serum sodium is 132 on admission  - Appears euvolemic, likely secondary to HCTZ    PPE: mask, face shield  DVT prophylaxis: Lovenox  Code Status: Full  Family Communication: Discussed with patient  Consults called: Neurology Admission status: Observation     Vianne Bulls, MD [facility] Pager [phone]  If 7PM-7AM, please contact night-coverage [email or web address] Password [email or web address]  01/13/2019, 1:29 AM

## 2019-01-13 NOTE — Progress Notes (Signed)
  MOSLEY STITZ is a 73 y.o. female admitted from ED awake, alert  & orientated  X4,  Full Code, VSS - Blood pressure (!) 160/68, pulse 69, temperature 98 F (36.7 C), temperature source Oral, resp. rate 17, height 5\' 1"  (1.549 m), weight 65 kg, SpO2 98 %. No c/o shortness of breath, no c/o chest pain, no distress noted. Tele # [id number] placed.  Allergies:  Penicillins  PMH: anemia, DM, HLD, HTN, palpitations  Pt orientation to unit, room and routine. Admission INP armband ID verified with patient/family, and in place. Pt verbalizes an understanding of how to use the call bell and to call for help before getting out of bed. Skin, clean-dry- intact without evidence of bruising, or skin tears. No evidence of skin break down noted on exam.   Will cont to monitor and assist as needed.

## 2019-01-13 NOTE — Progress Notes (Signed)
EEG complete - results pending 

## 2019-01-13 NOTE — Discharge Instructions (Signed)
Ischemic Stroke  An ischemic stroke (cerebrovascular accident, or CVA) is the sudden death of brain tissue that occurs when an area of the brain does not get enough oxygen. It is a medical emergency that must be treated right away. An ischemic stroke can cause permanent loss of brain function. This can cause problems with how different parts of your body function. What are the causes? This condition is caused by a decrease of oxygen supply to an area of the brain, which may be the result of:  A small blood clot (embolus) or a buildup of plaque in the blood vessels (atherosclerosis) that blocks blood flow in the brain.  An abnormal heart rhythm (atrial fibrillation).  A blocked or damaged artery in the head or neck. Sometimes the cause of stroke is not known (cryptogenic). What increases the risk? Certain factors may make you more likely to develop this condition. Some of these factors are things that you can change, such as:  Obesity.  Smoking cigarettes.  Taking oral birth control, especially if you also use tobacco.  Physical inactivity.  Excessive alcohol use.  Use of illegal drugs, especially cocaine and methamphetamine. Other risk factors include:  High blood pressure (hypertension).  High cholesterol.  Diabetes mellitus.  Heart disease.  Being [place] American, Native American, Hispanic, or [place] Native.  Being over age 44.  Family history of stroke.  Previous history of blood clots, stroke, or transient ischemic attack (TIA).  Sickle cell disease.  Being a woman with a history of preeclampsia.  Migraine headache.  Sleep apnea.  Irregular heartbeats, such as atrial fibrillation.  Chronic inflammatory diseases, such as rheumatoid arthritis or lupus.  Blood clotting disorders (hypercoagulable state). What are the signs or symptoms? Symptoms of this condition usually develop suddenly, or you may notice them after waking up from sleep. Symptoms may include  sudden:  Weakness or numbness in your face, arm, or leg, especially on one side of your body.  Trouble walking or difficulty moving your arms or legs.  Loss of balance or coordination.  Confusion.  Slurred speech (dysarthria).  Trouble speaking, understanding speech, or both (aphasia).  Vision changes--such as double vision, blurred vision, or loss of vision--in one or both eyes.  Dizziness.  Nausea and vomiting.  Severe headache with no known cause. The headache is often described as the worst headache ever experienced. If possible, make note of the exact time that you last felt like your normal self and what time your symptoms started. Tell your health care provider. If symptoms come and go, this could be a sign of a warning stroke, or TIA. Get help right away, even if you feel better. How is this diagnosed? This condition may be diagnosed based on:  Your symptoms, your medical history, and a physical exam.  CT scan of the brain.  MRI.  CT angiogram. This test uses a computer to take X-rays of your arteries. A dye may be injected into your blood to show the inside of your blood vessels more clearly.  MRI angiogram. This is a type of MRI that is used to evaluate the blood vessels.  Cerebral angiogram. This test uses X-rays and a dye to show the blood vessels in the brain and neck. You may need to see a health care provider who specializes in stroke care. A stroke specialist can be seen in person or through communication using telephone or television technology (telemedicine). Other tests may also be done to find the cause of the stroke, such  as:  Electrocardiogram (ECG).  Continuous heart monitoring.  Echocardiogram.  Transesophageal echocardiogram (TEE).  Carotid ultrasound.  A scan of the brain circulation.  Blood tests.  Sleep study to check for sleep apnea. How is this treated? Treatment for this condition will depend on the duration, severity, and cause  of your symptoms and on the area of the brain affected. It is very important to get treatment at the first sign of stroke symptoms. Some treatments work better if they are done within 3-6 hours of the onset of stroke symptoms. These initial treatments may include:  Aspirin.  Medicines to control blood pressure.  Medicine given by injection to dissolve the blood clot (thrombolytic).  Treatments given directly to the affected artery to remove or dissolve the blood clot. Other treatment options may include:  Oxygen.  IV fluids.  Medicines to thin the blood (anticoagulants or antiplatelets).  Procedures to increase blood flow. Medicines and changes to your diet may be used to help treat and manage risk factors for stroke, such as diabetes, high cholesterol, and high blood pressure. After a stroke, you may work with physical, speech, mental health, or occupational therapists to help you recover. Follow these instructions at home: Medicines  Take over-the-counter and prescription medicines only as told by your health care provider.  If you were told to take a medicine to thin your blood, such as aspirin or an anticoagulant, take it exactly as told by your health care provider. ? Taking too much blood-thinning medicine can cause bleeding. ? If you do not take enough blood-thinning medicine, you will not have the protection that you need against another stroke and other problems.  Understand the side effects of taking anticoagulant medicine. When taking this type of medicine, make sure you: ? Hold pressure over any cuts for longer than usual. ? Tell your dentist and other health care providers that you are taking anticoagulants before you have any procedures that may cause bleeding. ? Avoid activities that may cause trauma or injury. Eating and drinking  Follow instructions from your health care provider about diet.  Eat healthy foods.  If your ability to swallow was affected by the  stroke, you may need to take steps to avoid choking, such as: ? Taking small bites when eating. ? Eating foods that are soft or pureed. Safety  Follow instructions from your health care team about physical activity.  Use a walker or cane as told by your health care provider.  Take steps to create a safe home environment in order to reduce the risk of falls. This may include: ? Having your home looked at by specialists. ? Installing grab bars in the bedroom and bathroom. ? Using safety equipment, such as raised toilets and a seat in the shower. General instructions  Do not use any tobacco products, such as cigarettes, chewing tobacco, and e-cigarettes. If you need help quitting, ask your health care provider.  Limit alcohol intake to no more than 1 drink a day for nonpregnant women and 2 drinks a day for men. One drink equals 12 oz of beer, 5 oz of wine, or 1 oz of hard liquor.  If you need help to stop using drugs or alcohol, ask your health care provider about a referral to a program or specialist.  Maintain an active and healthy lifestyle. Get regular exercise as told by your health care provider.  Keep all follow-up visits as told by your health care provider, including visits with all specialists on your  health care team. This is important. How is this prevented? Your risk of another stroke can be decreased by managing high blood pressure, high cholesterol, diabetes, heart disease, sleep apnea, and obesity. It can also be decreased by quitting smoking, limiting alcohol, and staying physically active. Your health care provider will continue to work with you on measures to prevent short-term and long-term complications of stroke. Get help right away if:   You have any symptoms of a stroke. "BE FAST" is an easy way to remember the main warning signs of a stroke: ? B - Balance. Signs are dizziness, sudden trouble walking, or loss of balance. ? E - Eyes. Signs are trouble seeing or a  sudden change in vision. ? F - Face. Signs are sudden weakness or numbness of the face, or the face or eyelid drooping on one side. ? A - Arms. Signs are weakness or numbness in an arm. This happens suddenly and usually on one side of the body. ? S - Speech. Signs are sudden trouble speaking, slurred speech, or trouble understanding what people say. ? T - Time. Time to call emergency services. Write down what time symptoms started.  You have other signs of a stroke, such as: ? A sudden, severe headache with no known cause. ? Nausea or vomiting. ? Seizure.  These symptoms may represent a serious problem that is an emergency. Do not wait to see if the symptoms will go away. Get medical help right away. Call your local emergency services (911 in the [place] Do not drive yourself to the hospital. Summary  An ischemic stroke (cerebrovascular accident, or CVA) is the sudden death of brain tissue that occurs when an area of the brain does not get enough oxygen.  Symptoms of this condition usually develop suddenly, or you may notice them after waking up from sleep.  It is very important to get treatment at the first sign of stroke symptoms. Stroke is a medical emergency that must be treated right away. This information is not intended to replace advice given to you by your health care provider. Make sure you discuss any questions you have with your health care provider. Document Released: 03/01/2005 Document Revised: 11/18/2017 Document Reviewed: 05/28/2015 Elsevier Patient Education  [place].

## 2019-01-13 NOTE — Progress Notes (Signed)
  Echocardiogram 2D Echocardiogram has been performed.  Merrie Roof F 01/13/2019, 11:35 AM

## 2019-01-13 NOTE — Evaluation (Signed)
Occupational Therapy Evaluation Patient Details Name: Carrie [name] MRN: [id number] DOB: June 27, 1945 Today's Date: 01/13/2019    History of Present Illness Carrie [name] is a 73 y.o. female with medical history significant for resistant hypertension and type 2 diabetes mellitus, now presenting to the emergency department with right-sided neurologic deficits.  Patient reports that she had been in her usual state of health until 01/11/2019 when she developed right facial weakness for she later was experiencing right upper and lower extremity weakness and a sensation as though she was dreaming.  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    Clinical Impression   Patient evaluated by Occupational Therapy with no further acute OT needs identified. All education has been completed and the patient has no further questions. Pt is able to perform ADLs mod I.  She demonstrates mild Lt superior and Lt inferior quadrant loss.   Recommend OPOT to ensure return to mod I with IADLs.  See below for any follow-up Occupational Therapy or equipment needs. OT is signing off. Thank you for this referral.      Follow Up Recommendations  Outpatient OT(to ensure return to baseline )    Equipment Recommendations  None recommended by OT    Recommendations for Other Services       Precautions / Restrictions Precautions Precautions: None Restrictions Weight Bearing Restrictions: No      Mobility Bed Mobility Overal bed mobility: Independent                Transfers Overall transfer level: Modified independent               General transfer comment: Pt moves slowly     Balance Overall balance assessment: Mild deficits observed, not formally tested                                         ADL either performed or assessed with clinical judgement   ADL Overall ADL's : Modified independent                                        General ADL Comments: moves a bit slower      Vision Baseline Vision/History: No visual deficits Patient Visual Report: No change from baseline Vision Assessment?: Yes Eye Alignment: Within Functional Limits Ocular Range of Motion: Within Functional Limits Alignment/Gaze Preference: Within Defined Limits Tracking/Visual Pursuits: Able to track stimulus in all quads without difficulty Visual Fields: Left visual field deficit;Right visual field deficit Additional Comments: 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 - instructed her to follow up with ophthalmologist for Humphrey field test - field appears to be ~10-15% decreased      Perception Perception Perception Tested?: Yes   Praxis Praxis Praxis tested?: Within functional limits    Pertinent Vitals/Pain Pain Assessment: No/denies pain     Hand Dominance Left   Extremity/Trunk Assessment Upper Extremity Assessment Upper Extremity Assessment: Overall WFL for tasks assessed   Lower Extremity Assessment Lower Extremity Assessment: Overall WFL for tasks assessed   Cervical / Trunk Assessment Cervical / Trunk Assessment: Normal   Communication Communication Communication: No difficulties   Cognition Arousal/Alertness: Awake/alert Behavior During Therapy: WFL for tasks assessed/performed Overall Cognitive Status: Within Functional Limits for tasks assessed  General Comments: able to alternate and divide attention and performed serial 2's to and from 100 while ambulating.  She performed simple pathfinding without difficulty    General Comments  reviewed BEFAST with pt     Exercises     Shoulder Instructions      Home Living Family/patient expects to be discharged to:: Private residence Living Arrangements: Spouse/significant other Available Help at Discharge: Family;Available PRN/intermittently Type of Home: House Home Access:  Stairs to enter [employer] of Steps: 2 Entrance Stairs-Rails: Right Home Layout: One level     Bathroom Shower/Tub: Occupational psychologist: Standard     Home Equipment: Bedside commode   Additional Comments: BSC is husband's from a sx, used in shower as seat      Prior Functioning/Environment Level of Independence: Independent        Comments: drives, retired        Secretary/administrator Problem List: Decreased activity tolerance;Impaired balance (sitting and/or standing);Impaired vision/perception      OT Treatment/Interventions:      OT Goals(Current goals can be found in the care plan section) Acute Rehab OT Goals Patient Stated Goal: return home OT Goal Formulation: All assessment and education complete, DC therapy  OT Frequency:     Barriers to D/C:            Co-evaluation              AM-PAC OT "6 Clicks" Daily Activity     Outcome Measure Help from another person eating meals?: None Help from another person taking care of personal grooming?: None Help from another person toileting, which includes using toliet, bedpan, or urinal?: None Help from another person bathing (including washing, rinsing, drying)?: None Help from another person to put on and taking off regular upper body clothing?: None Help from another person to put on and taking off regular lower body clothing?: None 6 Click Score: 24   End of Session Nurse Communication: Mobility status  Activity Tolerance: Patient tolerated treatment well Patient left: Other (comment)(gathering clothing for discharge )  OT Visit Diagnosis: Unsteadiness on feet (R26.81);Low vision, both eyes (H54.2)                Time: [id number] OT Time Calculation (min): 22 min Charges:  OT General Charges $OT Visit: 1 Visit OT Evaluation $OT Eval Low Complexity: 1 Low  Lucille Passy, OTR/L Acute Rehabilitation Services Pager [phone] Office [phone]   Lucille Passy M 01/13/2019, 6:09 PM

## 2019-01-13 NOTE — Evaluation (Signed)
Physical Therapy Evaluation Patient Details Name: Carrie [name] MRN: [id number] DOB: 1945/10/21 Today's Date: 01/13/2019   History of Present Illness  Carrie [name] is a 73 y.o. female with medical history significant for resistant hypertension and type 2 diabetes mellitus, now presenting to the emergency department with right-sided neurologic deficits.  Patient reports that she had been in her usual state of health until 01/11/2019 when she developed right facial weakness for she later was experiencing right upper and lower extremity weakness and a sensation as though she was dreaming.  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   Clinical Impression  Pt admitted with above diagnosis. Pt presents with mild deficits, occasional L stagger during ambulation and decreased dynamic balance. Recommend OP neuro PT for balance and will follow acutely.  Pt currently with functional limitations due to the deficits listed below (see PT Problem List). Pt will benefit from skilled PT to increase their independence and safety with mobility to allow discharge to the venue listed below.       Follow Up Recommendations Outpatient PT    Equipment Recommendations  None recommended by PT    Recommendations for Other Services       Precautions / Restrictions Precautions Precautions: None Restrictions Weight Bearing Restrictions: No      Mobility  Bed Mobility Overal bed mobility: Modified Independent                Transfers Overall transfer level: Needs assistance Equipment used: None Transfers: Sit to/from Stand Sit to Stand: Supervision         General transfer comment: pt denies dizziness, no imbalance noted  Ambulation/Gait Ambulation/Gait assistance: Min guard;Min assist Gait Distance (Feet): 200 Feet Assistive device: None Gait Pattern/deviations: Step-through pattern;Staggering left Gait velocity: decreased Gait velocity interpretation: 1.31 - 2.62  ft/sec, indicative of limited community ambulator General Gait Details: min-guard A for the most part but occasional stagger L, given min A at that point for safety. Slow gait at first but uncreased pace with time and distance  Stairs Stairs: Yes Stairs assistance: Min guard Stair Management: One rail Left;Alternating pattern;Forwards Number of Stairs: 3 General stair comments: no difficulties or asymmetry on stairs with use of rail  Wheelchair Mobility    Modified Rankin (Stroke Patients Only) Modified Rankin (Stroke Patients Only) Pre-Morbid Rankin Score: No symptoms Modified Rankin: Slight disability     Balance Overall balance assessment: Needs assistance Sitting-balance support: Feet supported;No upper extremity supported Sitting balance-Leahy Scale: Good     Standing balance support: No upper extremity supported Standing balance-Leahy Scale: Good Standing balance comment: limitations evident with dynamic balance, decreased ability to correct for L LOB                             Pertinent Vitals/Pain Pain Assessment: No/denies pain    Home Living Family/patient expects to be discharged to:: Private residence Living Arrangements: Spouse/significant other Available Help at Discharge: Family;Available PRN/intermittently Type of Home: House Home Access: Stairs to enter Entrance Stairs-Rails: Right Entrance Stairs-Number of Steps: 2 Home Layout: One level Home Equipment: Bedside commode Additional Comments: BSC is husband's from a sx, used in shower as seat    Prior Function Level of Independence: Independent         Comments: drives, retired     Engineer, manufacturing Dominance   Dominant Hand: Left    Extremity/Trunk Assessment   Upper Extremity Assessment Upper Extremity Assessment: Defer to  OT evaluation    Lower Extremity Assessment Lower Extremity Assessment: Overall WFL for tasks assessed    Cervical / Trunk Assessment Cervical / Trunk  Assessment: Normal  Communication   Communication: No difficulties  Cognition Arousal/Alertness: Awake/alert Behavior During Therapy: WFL for tasks assessed/performed Overall Cognitive Status: Within Functional Limits for tasks assessed                                        General Comments General comments (skin integrity, edema, etc.): VSS    Exercises     Assessment/Plan    PT Assessment Patient needs continued PT services  PT Problem List Decreased balance       PT Treatment Interventions Gait training;Stair training;Functional mobility training;Therapeutic activities;Therapeutic exercise;Balance training;Patient/family education;Neuromuscular re-education    PT Goals (Current goals can be found in the Care Plan section)  Acute Rehab PT Goals Patient Stated Goal: return home PT Goal Formulation: With patient Time For Goal Achievement: 01/27/19 Potential to Achieve Goals: Good Additional Goals Additional Goal #1: Pt to score >45 on Berg balance    Frequency Min 4X/week   Barriers to discharge        Co-evaluation               AM-PAC PT "6 Clicks" Mobility  Outcome Measure Help needed turning from your back to your side while in a flat bed without using bedrails?: None Help needed moving from lying on your back to sitting on the side of a flat bed without using bedrails?: None Help needed moving to and from a bed to a chair (including a wheelchair)?: None Help needed standing up from a chair using your arms (e.g., wheelchair or bedside chair)?: None Help needed to walk in hospital room?: A Little Help needed climbing 3-5 steps with a railing? : A Little 6 Click Score: 22    End of Session Equipment Utilized During Treatment: Gait belt Activity Tolerance: Patient tolerated treatment well Patient left: in bed;with call bell/phone within reach Nurse Communication: Mobility status PT Visit Diagnosis: Unsteadiness on feet (R26.81)     Time: [id number] PT Time Calculation (min) (ACUTE ONLY): 38 min   Charges:   PT Evaluation $PT Eval Moderate Complexity: 1 Mod PT Treatments $Gait Training: 8-22 mins        Leighton Roach, [place]  Pager [phone] Office [place] 01/13/2019, 12:07 PM

## 2019-01-13 NOTE — Consult Note (Addendum)
NEURO HOSPITALIST  CONSULT   Requesting Physician: Dr. Doristine Bosworth    Chief Complaint: right sided weakness  History obtained from:  Patient   HPI:                                                                                                                                         Carrie [name] is an 73 y.o. female  With PMH HTN, HLD, DM who presented to [facility] with right sided weakness.  Patient states that she was her usual self until 01/11/2019 at about 6 pm when she was eating dinner at the table she developed some right facial weakness.  She states " she felt her face draw up, but denies any facial droop.  She said it felt like her face got tight, she couldn't chew and felt like she could not swallow. her  right arm felt like she could not control it. She also describes a moment of what sounds like expressive aphasia. She stopped eating and went to lay down. She fell asleep and woke up about 2am. At this time she had trouble with her right arm and leg and could not get out of bed. She slid to the end of her bed ( she did not want to wake her husband. She grabbed her prayer cloth and eventually was able to get to the chair. After about 40 minutes of praying she said she felt better and more clear.  She described the episode like a dream. She  Had a deja vu feeling like she was watching herself in slow motion.. Denies any CP, SOB, vision changes, smoking, ETOH, drug use. Usually takes ASA 81 mg but did not take a dose on Thursday because she ran out. She also reports a small period of cloudiness where she was forgetful Thursday morning around breakfast time.  Hospital course: CTH: low densities in bilateral occipital lobes suspicious for infarct. CTA: no LVO MRI: acute/subacute infarcts posterior lentiform nucleus and left occipital pole.   Date last known well: 01/11/2019 Time last known well: 1800 tPA Given: no; outside of window Modified  Rankin: Rankin Score=0 NIHSS:1 slight facial droop    Past Medical History:  Diagnosis Date  . Anemia   . Diabetes mellitus   . DM (diabetes mellitus) ([place]) 10/19/2018  . HLD (hyperlipidemia) 10/19/2018  . HTN (hypertension) 10/19/2018  . Hypertension   . Palpitations 10/19/2018    Past Surgical History:  Procedure Laterality Date  . ABDOMINAL HYSTERECTOMY     1993  . CESAREAN SECTION      Family History  Problem Relation Age of Onset  . Hypertension Father   .  Diabetes Mother   . Hypertension Mother   . Diabetes Brother   . Diabetes Sister          Social History:  reports that she has never smoked. She has never used smokeless tobacco. She reports that she does not drink alcohol or use drugs.  Allergies:  Allergies  Allergen Reactions  . Penicillins Hives    Medications:                                                                                                                           Scheduled: .  stroke: mapping our early stages of recovery book   Does not apply Once  . aspirin EC  81 mg Oral Daily  . clopidogrel  75 mg Oral Daily  . insulin aspart  0-5 Units Subcutaneous QHS  . insulin aspart  0-9 Units Subcutaneous TID WC  . sodium chloride flush  3 mL Intravenous Once   Continuous: . sodium chloride 100 mL/hr at 01/13/19 0718   [id number] **OR** acetaminophen (TYLENOL) oral liquid 160 mg/5 mL **OR** acetaminophen, senna-docusate   ROS:                                                                                                                                       ROS was performed and is negative except as noted in HPI    General Examination:                                                                                                      Blood pressure (!) 170/71, pulse 68, temperature 97.8 F (36.6 C), temperature source Tympanic, resp. rate 16, height 5\' 1"  (1.549 m), weight 65 kg, SpO2 100 %.  HEENT-  Normocephalic, no lesions,  without obvious abnormality.  Normal external eye and conjunctiva. Cardiovascular- S1-S2 audible, pulses palpable throughout  Lungs-no rhonchi or wheezing noted, no excessive working breathing.  Saturations within normal limits Abdomen- All 4 quadrants  palpated and non tender Extremities- Warm, dry and intact Musculoskeletal-no joint tenderness, deformity or swelling Skin-warm and dry, no hyperpigmentation, vitiligo, or suspicious lesions  Neurological Examination Mental Status: Alert, oriented name/age/month/year/ city thought content appropriate.  Speech fluent without evidence of aphasia.  Able to follow  commands without difficulty. Naming intact. No dysarthria noted. Cranial Nerves: II: Visual fields grossly normal,  III,IV, VI: ptosis not present, extra-ocular motions intact bilaterally, pupils equal, round, reactive to light and accommodation V,VII: smile asymmetric, slight right facial droop,  facial light touch sensation normal bilaterally VIII: hearing normal bilaterally IX,X: uvula rises midline XI: bilateral shoulder shrug XII: midline tongue extension Motor: Right : Upper extremity   5/5  Left:     Upper extremity   5/5  Lower extremity   5/5   Lower extremity   5/5 Tone and bulk:normal tone throughout; no atrophy noted Sensory:  light touch intact throughout, bilaterally Deep Tendon Reflexes: 2+ and symmetric biceps and patella Plantars: Right: downgoing   Left: downgoing Cerebellar: normal finger-to-nose,  and normal heel-to-shin test Gait: deferred   Lab Results: Basic Metabolic Panel: Recent Labs  Lab 01/12/19 1535  NA 132*  K 4.6  CL 93*  CO2 28  GLUCOSE 148*  BUN 27*  CREATININE 0.98  CALCIUM 10.4*    CBC: Recent Labs  Lab 01/12/19 1535 01/13/19 0056  WBC 7.4 7.8  NEUTROABS  --  4.4  HGB 13.5 13.9  HCT 41.9 43.9  MCV 81.2 81.1  PLT 361 356    Lipid Panel: Recent Labs  Lab 01/13/19 0506  CHOL 213*  TRIG 89  HDL 66  CHOLHDL 3.2  VLDL  18  LDLCALC 129*    CBG: Recent Labs  Lab 01/13/19 0355 01/13/19 0858  GLUCAP 173* 124*    Imaging: Ct Angio Head W Or Wo Contrast  Result Date: 01/13/2019 CLINICAL DATA:  Stroke follow-up EXAM: CT ANGIOGRAPHY HEAD AND NECK TECHNIQUE: Multidetector CT imaging of the head and neck was performed using the standard protocol during bolus administration of intravenous contrast. Multiplanar CT image reconstructions and MIPs were obtained to evaluate the vascular anatomy. Carotid stenosis measurements (when applicable) are obtained utilizing NASCET criteria, using the distal internal carotid diameter as the denominator. CONTRAST:  157mL OMNIPAQUE IOHEXOL 350 MG/ML SOLN COMPARISON:  None. FINDINGS: CTA NECK FINDINGS SKELETON: There is no bony spinal canal stenosis. No lytic or blastic lesion. OTHER NECK: Normal pharynx, larynx and major salivary glands. No cervical lymphadenopathy. Unremarkable thyroid gland. UPPER CHEST: No pneumothorax or pleural effusion. No nodules or masses. AORTIC ARCH: There is mild calcific atherosclerosis of the aortic arch. There is no aneurysm, dissection or hemodynamically significant stenosis of the visualized portion of the aorta. Conventional 3 vessel aortic branching pattern. The visualized proximal subclavian arteries are widely patent. RIGHT CAROTID SYSTEM: Normal without aneurysm, dissection or stenosis. LEFT CAROTID SYSTEM: Normal without aneurysm, dissection or stenosis. VERTEBRAL ARTERIES: Codominant configuration. Both origins are clearly patent. There is no dissection, occlusion or flow-limiting stenosis to the skull base (V1-V3 segments). CTA HEAD FINDINGS POSTERIOR CIRCULATION: --Vertebral arteries: Normal V4 segments. --Posterior inferior cerebellar arteries (PICA): Patent origins from the vertebral arteries. --Anterior inferior cerebellar arteries (AICA): Patent origins from the basilar artery. --Basilar artery: Normal. --Superior cerebellar arteries: Normal.  --Posterior cerebral arteries: Normal. Both originate from the basilar artery. Posterior communicating arteries (p-comm) are diminutive or absent. ANTERIOR CIRCULATION: --Intracranial internal carotid arteries: Atherosclerotic calcification of the internal carotid arteries at the skull base without hemodynamically significant stenosis. --Anterior  cerebral arteries (ACA): Normal. Both A1 segments are present. Patent anterior communicating artery (a-comm). --Middle cerebral arteries (MCA): Normal. VENOUS SINUSES: As permitted by contrast timing, patent. ANATOMIC VARIANTS: None Review of the MIP images confirms the above findings. IMPRESSION: 1. No emergent large vessel occlusion or high-grade stenosis of the intracranial arteries. 2. Aortic Atherosclerosis (ICD10-I70.0). Electronically Signed   By: Ulyses Jarred M.D.   On: 01/13/2019 05:16   Ct Head Wo Contrast  Result Date: 01/12/2019 CLINICAL DATA:  Right head numbness and weakness for 30 minutes with garbled speech yesterday at 10:30 a.m. Altered mental status since. EXAM: CT HEAD WITHOUT CONTRAST TECHNIQUE: Contiguous axial images were obtained from the base of the skull through the vertex without intravenous contrast. COMPARISON:  None. FINDINGS: Brain: Minimally enlarged ventricles and subarachnoid spaces. Mild patchy white matter low density in both cerebral hemispheres. Ill defined low density in both occipital lobes, involving the gray and white matter. No mass effect or intracranial hemorrhage. Vascular: No hyperdense vessel or unexpected calcification. Skull: Mild bilateral hyperostosis frontalis. Sinuses/Orbits: Unremarkable. Other: None. IMPRESSION: 1. Ill defined low density in both occipital lobes, involving the gray and white matter, suspicious for subacute infarcts. Further evaluation with brain MRI is recommended. 2. Minimal diffuse cerebral and cerebellar atrophy. 3. Mild chronic small vessel white matter ischemic changes in both cerebral  hemispheres. These results will be called to the ordering clinician or representative by the Radiologist Assistant, and communication documented in the PACS or zVision Dashboard. Electronically Signed   By: Claudie Revering M.D.   On: 01/12/2019 13:03   Ct Angio Neck W Or Wo Contrast  Result Date: 01/13/2019 CLINICAL DATA:  Stroke follow-up EXAM: CT ANGIOGRAPHY HEAD AND NECK TECHNIQUE: Multidetector CT imaging of the head and neck was performed using the standard protocol during bolus administration of intravenous contrast. Multiplanar CT image reconstructions and MIPs were obtained to evaluate the vascular anatomy. Carotid stenosis measurements (when applicable) are obtained utilizing NASCET criteria, using the distal internal carotid diameter as the denominator. CONTRAST:  140mL OMNIPAQUE IOHEXOL 350 MG/ML SOLN COMPARISON:  None. FINDINGS: CTA NECK FINDINGS SKELETON: There is no bony spinal canal stenosis. No lytic or blastic lesion. OTHER NECK: Normal pharynx, larynx and major salivary glands. No cervical lymphadenopathy. Unremarkable thyroid gland. UPPER CHEST: No pneumothorax or pleural effusion. No nodules or masses. AORTIC ARCH: There is mild calcific atherosclerosis of the aortic arch. There is no aneurysm, dissection or hemodynamically significant stenosis of the visualized portion of the aorta. Conventional 3 vessel aortic branching pattern. The visualized proximal subclavian arteries are widely patent. RIGHT CAROTID SYSTEM: Normal without aneurysm, dissection or stenosis. LEFT CAROTID SYSTEM: Normal without aneurysm, dissection or stenosis. VERTEBRAL ARTERIES: Codominant configuration. Both origins are clearly patent. There is no dissection, occlusion or flow-limiting stenosis to the skull base (V1-V3 segments). CTA HEAD FINDINGS POSTERIOR CIRCULATION: --Vertebral arteries: Normal V4 segments. --Posterior inferior cerebellar arteries (PICA): Patent origins from the vertebral arteries. --Anterior inferior  cerebellar arteries (AICA): Patent origins from the basilar artery. --Basilar artery: Normal. --Superior cerebellar arteries: Normal. --Posterior cerebral arteries: Normal. Both originate from the basilar artery. Posterior communicating arteries (p-comm) are diminutive or absent. ANTERIOR CIRCULATION: --Intracranial internal carotid arteries: Atherosclerotic calcification of the internal carotid arteries at the skull base without hemodynamically significant stenosis. --Anterior cerebral arteries (ACA): Normal. Both A1 segments are present. Patent anterior communicating artery (a-comm). --Middle cerebral arteries (MCA): Normal. VENOUS SINUSES: As permitted by contrast timing, patent. ANATOMIC VARIANTS: None Review of the MIP images confirms the above  findings. IMPRESSION: 1. No emergent large vessel occlusion or high-grade stenosis of the intracranial arteries. 2. Aortic Atherosclerosis (ICD10-I70.0). Electronically Signed   By: Ulyses Jarred M.D.   On: 01/13/2019 05:16   Mr Brain Wo Contrast  Result Date: 01/13/2019 CLINICAL DATA:  TIA, initial exam. Right-sided weakness 2 days ago. EXAM: MRI HEAD WITHOUT CONTRAST TECHNIQUE: Multiplanar, multiecho pulse sequences of the brain and surrounding structures were obtained without intravenous contrast. COMPARISON:  CT head without contrast 01/12/2019. CTA head and neck 01/13/2019 FINDINGS: Brain: The diffusion-weighted images demonstrate acute/subacute infarct involving the posterior aspect of the left lentiform nucleus. The internal capsule is spared. A 14 mm acute nonhemorrhagic infarct is also noted at the left occipital pole. There is subtle T2 signal associated with both regions. Confluent periventricular and scattered subcortical T2 hyperintensities are otherwise mildly advanced for age. Mild generalized atrophy is noted. Ventricles are of normal size. No significant extra-axial fluid collection present. No acute hemorrhage or mass lesion is present. The  brainstem and cerebellum are within normal limits. Vascular: Flow is present in the major intracranial arteries. Skull and upper cervical spine: Mild degenerative changes are noted in the cervical spine at C5-6. Apart from endplate changes, marrow signal is normal. The craniocervical junction is normal. Midline structures are otherwise unremarkable. Sinuses/Orbits: The paranasal sinuses and mastoid air cells are clear. The globes and orbits are within normal limits. IMPRESSION: 1. Acute/subacute nonhemorrhagic infarcts involving the posterior left lentiform nucleus and left occipital pole. 2. Atrophy and white matter disease is mildly advanced for age. This likely reflects the sequela of chronic microvascular ischemia. Electronically Signed   By: San Morelle M.D.   On: 01/13/2019 07:37       Laurey Morale, MSN, NP-C [facility] [phone]  01/13/2019, 10:57 AM   Attending physician note to follow with Assessment and plan . I have seen the patient reviewed the above note.  Assessment: 73 y.o. female With PMH HTN, HLD, DM who presented to [facility] with right sided weakness. Her CTH showed some densities in both occipital lobes suspicious for subacute stroke. MRI showed acute/subacute infarcts in posterior lentiform nucleus and left occipital pole. TPA was not offered d/t patient presenting outside of the window. The CTA did not show an LVO. Complete stroke work-up needed.  Given the description of her stroke. Hgba1c=7.4 LDL=129.   The appearance on MRI is slightly unusual, though this is likely due to the nature of a transient perfusion deficit. I would favor getting EEG, but suspect stroke, not seizure.     Stroke Risk Factors - diabetes mellitus, hyperlipidemia and hypertension    Recommendations: -- BP goal : Permissive HTN upto 220/110 mmHg (for 24-48 post admission )  --Echocardiogram ( pending) -- ASA and plavix -- start atorvastatin for LDL of 129 -- PT consult,  OT consult, Speech consult --Telemetry monitoring, may need outpatient longer term monitoring as well.  --Frequent neuro checks --Stroke swallow screen  --EEG  Roland Rack, MD [facility] [phone]  If 7pm- 7am, please page neurology on call as listed in [facility].

## 2019-01-13 NOTE — Discharge Summary (Signed)
Physician Discharge Summary  [name] [id number] DOB: 1946/03/05 DOA: 01/12/2019  PCP: Jani Gravel, MD  Admit date: 01/12/2019 Discharge date: 01/13/2019  Admitted From: Home Disposition: Home  Recommendations for Outpatient Follow-up:  1. Follow up with PCP in 1-2 weeks 2. Please obtain BMP/CBC in one week 3. Please follow up on the following pending results:  Home Health: None Equipment/Devices: None  Discharge Condition: Stable CODE STATUS: Full code Diet recommendation: Cardiac  Subjective: Patient seen and examined.  No complaints.  No more right-sided weakness.  No issues with speech, swallow or vision.  Brief/Interim Summary: Carrie [name] is a 73 y.o. female with medical history significant for resistant hypertension and type 2 diabetes mellitus presented to emergency department with right-sided neurologic deficits.  Patient reports that she had been in her usual state of health until 01/11/2019 when she developed right facial weakness for she later was experiencing right upper and lower extremity weakness and a sensation as though she was dreaming.  She rates these concerns with her PCP, CT head was obtained, there was concern for ischemic infarctions in the occipital lobe, and she was directed to the ED.  Upon arrival to the ED, patient is found to be afebrile, saturating well on room air, and with stable blood pressure.  Chemistry panel is notable for sodium of 132 and elevated BUN to creatinine ratio.  CBC is unremarkable.  CT head without contrast is notable for ill-defined low-density involving the bilateral occipital lobes suspicious for subacute infarction.  Neurology was consulted by ED and patient was admitted under hospitalist service.  CT angiogram of head and neck was unremarkable.  MRI confirmed acute/subacute nonhemorrhagic infarct involving posterior left lentiform nucleus and left occipital lobe.  Patient symptoms resolved and did not recur during this  hospitalization.  Transthoracic echo did not show any ASD or VSD and had normal ejection fraction.  She was evaluated by PT OT and they recommended outpatient PT.  Lipid profile show LDL of 129.  She was started on DAPT, aspirin and Plavix as well as Lipitor.  Hemoglobin A1c 7.4.  Since she was very close to her goal of less than 7 so I highly recommended her weight loss and diet control which will help bring it to goal with her current Janumet 50/1000 twice daily.  We allowed permissive hypertension.  Now that it has been more than 2 days that her symptoms started so she can resume her home medications.  She is being discharged on aspirin 81 mg along with Plavix 75 mg as well as atorvastatin 40 mg p.o. daily.  She is advised to stop Plavix after 3 weeks and continue rest of the medications.  She is being discharged in stable condition.  Discharge Diagnoses:  Principal Problem:   Ischemic stroke [facility]) Active Problems:   Diabetes mellitus type II, non insulin dependent ([place])   HTN (hypertension)   Hyponatremia    Discharge Instructions  Discharge Instructions    Discharge patient   Complete by: As directed    Discharge disposition: 01-Home or Self Care   Discharge patient date: 01/13/2019     Allergies as of 01/13/2019      Reactions   Penicillins Hives      Medication List    TAKE these medications   amLODipine 10 MG tablet Commonly known as: NORVASC Take 5 mg by mouth daily.   aspirin 81 MG EC tablet Take 1 tablet (81 mg total) by mouth daily. Start taking on: January 14, 2019   atorvastatin 40 MG tablet Commonly known as: LIPITOR Take 1 tablet (40 mg total) by mouth daily at 6 PM.   cloNIDine 0.2 MG tablet Commonly known as: Catapres Take 1 tablet (0.2 mg total) by mouth 2 (two) times daily.   clopidogrel 75 MG tablet Commonly known as: PLAVIX Take 1 tablet (75 mg total) by mouth daily for 21 days. Start taking on: January 14, 2019   diazepam 5 MG tablet Commonly  known as: VALIUM Take 5 mg by mouth every 6 (six) hours as needed for anxiety.   empagliflozin 25 MG Tabs tablet Commonly known as: JARDIANCE Take 25 mg by mouth daily.   esomeprazole 40 MG capsule Commonly known as: NEXIUM Take 40 mg by mouth daily as needed (for acid reflux).   Fusion Plus Caps Take 1 capsule by mouth daily.   glimepiride 1 MG tablet Commonly known as: AMARYL Take 1 mg by mouth daily.   hydrochlorothiazide 25 MG tablet Commonly known as: HYDRODIURIL Take 25 mg by mouth daily.   metoprolol succinate 100 MG 24 hr tablet Commonly known as: TOPROL-XL Take 100 mg by mouth daily.   sitaGLIPtin-metformin 50-1000 MG tablet Commonly known as: JANUMET Take 1 tablet by mouth 2 (two) times daily with a meal.   spironolactone 25 MG tablet Commonly known as: ALDACTONE Take 25 mg by mouth daily.   telmisartan 40 MG tablet Commonly known as: MICARDIS Take 20 mg by mouth daily.      Follow-up Information    Jani Gravel, MD Follow up in 1 week(s).   Specialty: Internal Medicine Contact information: [place] [address] [phone]          Allergies  Allergen Reactions  . Penicillins Hives    Consultations: Neurology   Procedures/Studies: Ct Angio Head W Or Wo Contrast  Result Date: 01/13/2019 CLINICAL DATA:  Stroke follow-up EXAM: CT ANGIOGRAPHY HEAD AND NECK TECHNIQUE: Multidetector CT imaging of the head and neck was performed using the standard protocol during bolus administration of intravenous contrast. Multiplanar CT image reconstructions and MIPs were obtained to evaluate the vascular anatomy. Carotid stenosis measurements (when applicable) are obtained utilizing NASCET criteria, using the distal internal carotid diameter as the denominator. CONTRAST:  143mL OMNIPAQUE IOHEXOL 350 MG/ML SOLN COMPARISON:  None. FINDINGS: CTA NECK FINDINGS SKELETON: There is no bony spinal canal stenosis. No lytic or blastic lesion.  OTHER NECK: Normal pharynx, larynx and major salivary glands. No cervical lymphadenopathy. Unremarkable thyroid gland. UPPER CHEST: No pneumothorax or pleural effusion. No nodules or masses. AORTIC ARCH: There is mild calcific atherosclerosis of the aortic arch. There is no aneurysm, dissection or hemodynamically significant stenosis of the visualized portion of the aorta. Conventional 3 vessel aortic branching pattern. The visualized proximal subclavian arteries are widely patent. RIGHT CAROTID SYSTEM: Normal without aneurysm, dissection or stenosis. LEFT CAROTID SYSTEM: Normal without aneurysm, dissection or stenosis. VERTEBRAL ARTERIES: Codominant configuration. Both origins are clearly patent. There is no dissection, occlusion or flow-limiting stenosis to the skull base (V1-V3 segments). CTA HEAD FINDINGS POSTERIOR CIRCULATION: --Vertebral arteries: Normal V4 segments. --Posterior inferior cerebellar arteries (PICA): Patent origins from the vertebral arteries. --Anterior inferior cerebellar arteries (AICA): Patent origins from the basilar artery. --Basilar artery: Normal. --Superior cerebellar arteries: Normal. --Posterior cerebral arteries: Normal. Both originate from the basilar artery. Posterior communicating arteries (p-comm) are diminutive or absent. ANTERIOR CIRCULATION: --Intracranial internal carotid arteries: Atherosclerotic calcification of the internal carotid arteries at the skull base without hemodynamically significant stenosis. --  Anterior cerebral arteries (ACA): Normal. Both A1 segments are present. Patent anterior communicating artery (a-comm). --Middle cerebral arteries (MCA): Normal. VENOUS SINUSES: As permitted by contrast timing, patent. ANATOMIC VARIANTS: None Review of the MIP images confirms the above findings. IMPRESSION: 1. No emergent large vessel occlusion or high-grade stenosis of the intracranial arteries. 2. Aortic Atherosclerosis (ICD10-I70.0). Electronically Signed   By: Ulyses Jarred M.D.   On: 01/13/2019 05:16   Ct Head Wo Contrast  Result Date: 01/12/2019 CLINICAL DATA:  Right head numbness and weakness for 30 minutes with garbled speech yesterday at 10:30 a.m. Altered mental status since. EXAM: CT HEAD WITHOUT CONTRAST TECHNIQUE: Contiguous axial images were obtained from the base of the skull through the vertex without intravenous contrast. COMPARISON:  None. FINDINGS: Brain: Minimally enlarged ventricles and subarachnoid spaces. Mild patchy white matter low density in both cerebral hemispheres. Ill defined low density in both occipital lobes, involving the gray and white matter. No mass effect or intracranial hemorrhage. Vascular: No hyperdense vessel or unexpected calcification. Skull: Mild bilateral hyperostosis frontalis. Sinuses/Orbits: Unremarkable. Other: None. IMPRESSION: 1. Ill defined low density in both occipital lobes, involving the gray and white matter, suspicious for subacute infarcts. Further evaluation with brain MRI is recommended. 2. Minimal diffuse cerebral and cerebellar atrophy. 3. Mild chronic small vessel white matter ischemic changes in both cerebral hemispheres. These results will be called to the ordering clinician or representative by the Radiologist Assistant, and communication documented in the PACS or zVision Dashboard. Electronically Signed   By: Claudie Revering M.D.   On: 01/12/2019 13:03   Ct Angio Neck W Or Wo Contrast  Result Date: 01/13/2019 CLINICAL DATA:  Stroke follow-up EXAM: CT ANGIOGRAPHY HEAD AND NECK TECHNIQUE: Multidetector CT imaging of the head and neck was performed using the standard protocol during bolus administration of intravenous contrast. Multiplanar CT image reconstructions and MIPs were obtained to evaluate the vascular anatomy. Carotid stenosis measurements (when applicable) are obtained utilizing NASCET criteria, using the distal internal carotid diameter as the denominator. CONTRAST:  14mL OMNIPAQUE IOHEXOL 350 MG/ML  SOLN COMPARISON:  None. FINDINGS: CTA NECK FINDINGS SKELETON: There is no bony spinal canal stenosis. No lytic or blastic lesion. OTHER NECK: Normal pharynx, larynx and major salivary glands. No cervical lymphadenopathy. Unremarkable thyroid gland. UPPER CHEST: No pneumothorax or pleural effusion. No nodules or masses. AORTIC ARCH: There is mild calcific atherosclerosis of the aortic arch. There is no aneurysm, dissection or hemodynamically significant stenosis of the visualized portion of the aorta. Conventional 3 vessel aortic branching pattern. The visualized proximal subclavian arteries are widely patent. RIGHT CAROTID SYSTEM: Normal without aneurysm, dissection or stenosis. LEFT CAROTID SYSTEM: Normal without aneurysm, dissection or stenosis. VERTEBRAL ARTERIES: Codominant configuration. Both origins are clearly patent. There is no dissection, occlusion or flow-limiting stenosis to the skull base (V1-V3 segments). CTA HEAD FINDINGS POSTERIOR CIRCULATION: --Vertebral arteries: Normal V4 segments. --Posterior inferior cerebellar arteries (PICA): Patent origins from the vertebral arteries. --Anterior inferior cerebellar arteries (AICA): Patent origins from the basilar artery. --Basilar artery: Normal. --Superior cerebellar arteries: Normal. --Posterior cerebral arteries: Normal. Both originate from the basilar artery. Posterior communicating arteries (p-comm) are diminutive or absent. ANTERIOR CIRCULATION: --Intracranial internal carotid arteries: Atherosclerotic calcification of the internal carotid arteries at the skull base without hemodynamically significant stenosis. --Anterior cerebral arteries (ACA): Normal. Both A1 segments are present. Patent anterior communicating artery (a-comm). --Middle cerebral arteries (MCA): Normal. VENOUS SINUSES: As permitted by contrast timing, patent. ANATOMIC VARIANTS: None Review of the MIP images confirms the  above findings. IMPRESSION: 1. No emergent large vessel occlusion  or high-grade stenosis of the intracranial arteries. 2. Aortic Atherosclerosis (ICD10-I70.0). Electronically Signed   By: Ulyses Jarred M.D.   On: 01/13/2019 05:16   Mr Brain Wo Contrast  Result Date: 01/13/2019 CLINICAL DATA:  TIA, initial exam. Right-sided weakness 2 days ago. EXAM: MRI HEAD WITHOUT CONTRAST TECHNIQUE: Multiplanar, multiecho pulse sequences of the brain and surrounding structures were obtained without intravenous contrast. COMPARISON:  CT head without contrast 01/12/2019. CTA head and neck 01/13/2019 FINDINGS: Brain: The diffusion-weighted images demonstrate acute/subacute infarct involving the posterior aspect of the left lentiform nucleus. The internal capsule is spared. A 14 mm acute nonhemorrhagic infarct is also noted at the left occipital pole. There is subtle T2 signal associated with both regions. Confluent periventricular and scattered subcortical T2 hyperintensities are otherwise mildly advanced for age. Mild generalized atrophy is noted. Ventricles are of normal size. No significant extra-axial fluid collection present. No acute hemorrhage or mass lesion is present. The brainstem and cerebellum are within normal limits. Vascular: Flow is present in the major intracranial arteries. Skull and upper cervical spine: Mild degenerative changes are noted in the cervical spine at C5-6. Apart from endplate changes, marrow signal is normal. The craniocervical junction is normal. Midline structures are otherwise unremarkable. Sinuses/Orbits: The paranasal sinuses and mastoid air cells are clear. The globes and orbits are within normal limits. IMPRESSION: 1. Acute/subacute nonhemorrhagic infarcts involving the posterior left lentiform nucleus and left occipital pole. 2. Atrophy and white matter disease is mildly advanced for age. This likely reflects the sequela of chronic microvascular ischemia. Electronically Signed   By: San Morelle M.D.   On: 01/13/2019 07:37      Discharge  Exam: Vitals:   01/13/19 1047 01/13/19 1221  BP: (!) 176/89 (!) 183/94  Pulse: 75 72  Resp: 17 14  Temp: 98.4 F (36.9 C) 98.1 F (36.7 C)  SpO2: 100% 100%   Vitals:   01/13/19 0851 01/13/19 0856 01/13/19 1047 01/13/19 1221  BP: (!) 208/74 (!) 170/71 (!) 176/89 (!) 183/94  Pulse: 68  75 72  Resp: 16  17 14   Temp: 97.8 F (36.6 C)  98.4 F (36.9 C) 98.1 F (36.7 C)  TempSrc: Tympanic  Oral Oral  SpO2: 100%  100% 100%  Weight:      Height:        General: Pt is alert, awake, not in acute distress Cardiovascular: RRR, S1/S2 +, no rubs, no gallops Respiratory: CTA bilaterally, no wheezing, no rhonchi Abdominal: Soft, NT, ND, bowel sounds + Extremities: no edema, no cyanosis    The results of significant diagnostics from this hospitalization (including imaging, microbiology, ancillary and laboratory) are listed below for reference.     Microbiology: Recent Results (from the past 240 hour(s))  SARS CORONAVIRUS 2 (TAT 6-24 HRS) Nasopharyngeal Nasopharyngeal Swab     Status: None   Collection Time: 01/13/19  1:10 AM   Specimen: Nasopharyngeal Swab  Result Value Ref Range Status   SARS Coronavirus 2 NEGATIVE NEGATIVE Final    Comment: (NOTE) SARS-CoV-2 target nucleic acids are NOT DETECTED. The SARS-CoV-2 RNA is generally detectable in upper and lower respiratory specimens during the acute phase of infection. Negative results do not preclude SARS-CoV-2 infection, do not rule out co-infections with other pathogens, and should not be used as the sole basis for treatment or other patient management decisions. Negative results must be combined with clinical observations, patient history, and epidemiological information. The expected result is  Negative. Fact Sheet for Patients: SugarRoll.be Fact Sheet for Healthcare Providers: https://www.woods-mathews.com/ This test is not yet approved or cleared by the [place] FDA and  has  been authorized for detection and/or diagnosis of SARS-CoV-2 by FDA under an Emergency Use Authorization (EUA). This EUA will remain  in effect (meaning this test can be used) for the duration of the COVID-19 declaration under Section 56 4(b)(1) of the Act, 21 U.S.C. section 360bbb-3(b)(1), unless the authorization is terminated or revoked sooner. Performed at [facility], [place] [address] [place], [place] [address]      Labs: BNP (last 3 results) No results for input(s): BNP in the last 8760 hours. Basic Metabolic Panel: Recent Labs  Lab 01/12/19 1535  NA 132*  K 4.6  CL 93*  CO2 28  GLUCOSE 148*  BUN 27*  CREATININE 0.98  CALCIUM 10.4*   Liver Function Tests: Recent Labs  Lab 01/12/19 1535  AST 22  ALT 16  ALKPHOS 88  BILITOT 0.7  PROT 7.8  ALBUMIN 3.9   No results for input(s): LIPASE, AMYLASE in the last 168 hours. No results for input(s): AMMONIA in the last 168 hours. CBC: Recent Labs  Lab 01/12/19 1535 01/13/19 0056  WBC 7.4 7.8  NEUTROABS  --  4.4  HGB 13.5 13.9  HCT 41.9 43.9  MCV 81.2 81.1  PLT 361 356   Cardiac Enzymes: No results for input(s): CKTOTAL, CKMB, CKMBINDEX, TROPONINI in the last 168 hours. BNP: Invalid input(s): POCBNP CBG: Recent Labs  Lab 01/13/19 0355 01/13/19 0858 01/13/19 1202  GLUCAP 173* 124* 161*   D-Dimer No results for input(s): DDIMER in the last 72 hours. Hgb A1c Recent Labs    01/13/19 0506  HGBA1C 7.4*   Lipid Profile Recent Labs    01/13/19 0506  CHOL 213*  HDL 66  LDLCALC 129*  TRIG 89  CHOLHDL 3.2   Thyroid function studies No results for input(s): TSH, T4TOTAL, T3FREE, THYROIDAB in the last 72 hours.  Invalid input(s): FREET3 Anemia work up No results for input(s): VITAMINB12, FOLATE, FERRITIN, TIBC, IRON, RETICCTPCT in the last 72 hours. Urinalysis    Component Value Date/Time   COLORURINE STRAW (A) 01/13/2019 0056   APPEARANCEUR CLEAR 01/13/2019 0056   LABSPEC 1.007  01/13/2019 0056   PHURINE 6.0 01/13/2019 0056   GLUCOSEU >=500 (A) 01/13/2019 0056   HGBUR NEGATIVE 01/13/2019 0056   BILIRUBINUR NEGATIVE 01/13/2019 0056   KETONESUR NEGATIVE 01/13/2019 0056   PROTEINUR NEGATIVE 01/13/2019 0056   UROBILINOGEN 0.2 07/24/2008 1332   NITRITE NEGATIVE 01/13/2019 0056   LEUKOCYTESUR NEGATIVE 01/13/2019 0056   Sepsis Labs Invalid input(s): PROCALCITONIN,  WBC,  LACTICIDVEN Microbiology Recent Results (from the past 240 hour(s))  SARS CORONAVIRUS 2 (TAT 6-24 HRS) Nasopharyngeal Nasopharyngeal Swab     Status: None   Collection Time: 01/13/19  1:10 AM   Specimen: Nasopharyngeal Swab  Result Value Ref Range Status   SARS Coronavirus 2 NEGATIVE NEGATIVE Final    Comment: (NOTE) SARS-CoV-2 target nucleic acids are NOT DETECTED. The SARS-CoV-2 RNA is generally detectable in upper and lower respiratory specimens during the acute phase of infection. Negative results do not preclude SARS-CoV-2 infection, do not rule out co-infections with other pathogens, and should not be used as the sole basis for treatment or other patient management decisions. Negative results must be combined with clinical observations, patient history, and epidemiological information. The expected result is Negative. Fact Sheet for Patients: SugarRoll.be Fact Sheet for Healthcare Providers: https://www.woods-mathews.com/ This  test is not yet approved or cleared by the [place] and  has been authorized for detection and/or diagnosis of SARS-CoV-2 by FDA under an Emergency Use Authorization (EUA). This EUA will remain  in effect (meaning this test can be used) for the duration of the COVID-19 declaration under Section 56 4(b)(1) of the Act, 21 U.S.C. section 360bbb-3(b)(1), unless the authorization is terminated or revoked sooner. Performed at [facility], [place] [address], [place], [place] [address]      Time coordinating  discharge: Over 30 minutes  SIGNED:   Darliss Cheney, MD  [facility] 01/13/2019, 3:25 PM  If 7PM-7AM, please contact night-coverage [email or web address] Password [email or web address]

## 2019-01-13 NOTE — Procedures (Signed)
History: 73 yo F being evaluated for aphasic episode.   Sedation: None  Technique: This is a 21 channel routine scalp EEG performed at the bedside with bipolar and monopolar montages arranged in accordance to the international 10/20 system of electrode placement. One channel was dedicated to EKG recording.    Background: The background consists of intermixed alpha and beta activities. There is a well defined posterior dominant rhythm of 8-9 Hz that attenuates with eye opening. Sleep is recorded with normal appearing structures.   Photic stimulation: Physiologic driving is absent  EEG Abnormalities: None  Clinical Interpretation: This normal EEG is recorded in the waking and sleep state. There was no seizure or seizure predisposition recorded on this study. Please note that lack of epileptiform activity on EEG does not preclude the possibility of epilepsy.   Roland Rack, MD [facility] [phone]  If 7pm- 7am, please page neurology on call as listed in [place].

## 2019-01-17 DIAGNOSIS — D509 Iron deficiency anemia, unspecified: Secondary | ICD-10-CM | POA: Diagnosis not present

## 2019-01-17 DIAGNOSIS — D649 Anemia, unspecified: Secondary | ICD-10-CM | POA: Diagnosis not present

## 2019-01-17 DIAGNOSIS — Z09 Encounter for follow-up examination after completed treatment for conditions other than malignant neoplasm: Secondary | ICD-10-CM | POA: Diagnosis not present

## 2019-01-17 DIAGNOSIS — Z79899 Other long term (current) drug therapy: Secondary | ICD-10-CM | POA: Diagnosis not present

## 2019-01-17 DIAGNOSIS — E118 Type 2 diabetes mellitus with unspecified complications: Secondary | ICD-10-CM | POA: Diagnosis not present

## 2019-01-17 DIAGNOSIS — I10 Essential (primary) hypertension: Secondary | ICD-10-CM | POA: Diagnosis not present

## 2019-01-18 ENCOUNTER — Ambulatory Visit: Payer: Medicare Other | Attending: Internal Medicine | Admitting: Physical Therapy

## 2019-01-18 ENCOUNTER — Encounter: Payer: Self-pay | Admitting: Physical Therapy

## 2019-01-18 ENCOUNTER — Other Ambulatory Visit: Payer: Self-pay

## 2019-01-18 DIAGNOSIS — R2689 Other abnormalities of gait and mobility: Secondary | ICD-10-CM | POA: Diagnosis not present

## 2019-01-18 DIAGNOSIS — I69353 Hemiplegia and hemiparesis following cerebral infarction affecting right non-dominant side: Secondary | ICD-10-CM

## 2019-01-18 DIAGNOSIS — I10 Essential (primary) hypertension: Secondary | ICD-10-CM | POA: Diagnosis not present

## 2019-01-18 DIAGNOSIS — Z79899 Other long term (current) drug therapy: Secondary | ICD-10-CM | POA: Diagnosis not present

## 2019-01-18 NOTE — Therapy (Signed)
[facility] [address] [place], [place], [address] Phone: [phone]   Fax:  [phone]  Physical Therapy Evaluation  Patient Details  Name: Carrie [name] MRN: [id number] Date of Birth: 07-Dec-1945 Referring Provider (PT): Darliss Cheney, MD   Encounter Date: 01/18/2019  PT End of Session - 01/18/19 0943    Visit Number  1    Number of Visits  8    Date for PT Re-Evaluation  02/22/19    Authorization Type  [employer] MCR    PT Start Time  [id number]    PT Stop Time  0940    PT Time Calculation (min)  43 min    Activity Tolerance  Patient tolerated treatment well    Behavior During Therapy  [facility] for tasks assessed/performed       Past Medical History:  Diagnosis Date  . Anemia   . Diabetes mellitus   . DM (diabetes mellitus) ([place]) 10/19/2018  . HLD (hyperlipidemia) 10/19/2018  . HTN (hypertension) 10/19/2018  . Hypertension   . Palpitations 10/19/2018    Past Surgical History:  Procedure Laterality Date  . ABDOMINAL HYSTERECTOMY     1993  . CESAREAN SECTION      There were no vitals filed for this visit.   Subjective Assessment - 01/18/19 0903    Subjective  73 y.o. female with medical history significant for resistant hypertension and type 2 diabetes mellitus presented to emergency department 01/11/19 with right-sided neurologic deficits. MRI confirmed acute/subacute nonhemorrhagic infarct involving posterior left lentiform nucleus and left occipital lobe. She feels like she is getting off balance a little and takes shorter steps,and not back to full strength but denies any difficulty with cognition or speech    Patient Stated Goals  I'm not sure, they just told me I needed to come    Currently in Pain?  No/denies         [facility] PT Assessment - 01/18/19 0001      Assessment   Medical Diagnosis  Ischemic stroke    Referring Provider (PT)  Darliss Cheney, MD    Onset Date/Surgical Date  01/11/19    Hand Dominance   Left    Next MD Visit  unsure    Prior Therapy  PT in 2004 for her back      Precautions   Precautions  None      Balance Screen   Has the patient fallen in the past 6 months  No      [place] residence      Prior Function   Level of Independence  Independent      Cognition   Overall Cognitive Status  Within Functional Limits for tasks assessed      Coordination   Gross Motor Movements are Fluid and Coordinated  No   decreased on Rt, decreased RAMPS   Fine Motor Movements are Fluid and Coordinated  No   decreased Rt UE   Finger Nose Finger Test  decreased on Rt    Heel Shin Test  WFL      ROM / Strength   AROM / PROM / Strength  AROM;Strength      AROM   Overall AROM   Within functional limits for tasks performed      Strength   Overall Strength Comments  Lt UE 5/5, Rt UE grossly 4/5 MMT, WFL grip strength bilat, bilat leg strength 5/5 grossly except hips 4/5 bilat all tested  in sitting      Transfers   Transfers  Independent with all Transfers      Ambulation/Gait   Ambulation Distance (Feet)  150 Feet    Assistive device  None    Gait Pattern  Decreased arm swing - right;Decreased step length - right;Decreased step length - left;Decreased hip/knee flexion - right;Decreased hip/knee flexion - left    Ambulation Surface  Level;Indoor    Gait velocity  20 ft in 7.6 sec=2.6 ft/sec      Standardized Balance Assessment   Standardized Balance Assessment  Five Times Sit to Stand    Five times sit to stand comments   17 sec no UE support       Functional Gait  Assessment   Gait assessed   Yes    Gait Level Surface  Walks 20 ft, slow speed, abnormal gait pattern, evidence for imbalance or deviates 10-15 in outside of the 12 in walkway width. Requires more than 7 sec to ambulate 20 ft.    Change in Gait Speed  Able to smoothly change walking speed without loss of balance or gait deviation. Deviate no more than 6 in outside of the 12 in  walkway width.    Gait with Horizontal Head Turns  Performs head turns smoothly with slight change in gait velocity (eg, minor disruption to smooth gait path), deviates 6-10 in outside 12 in walkway width, or uses an assistive device.    Gait with Vertical Head Turns  Performs head turns with no change in gait. Deviates no more than 6 in outside 12 in walkway width.    Gait and Pivot Turn  Pivot turns safely in greater than 3 sec and stops with no loss of balance, or pivot turns safely within 3 sec and stops with mild imbalance, requires small steps to catch balance.    Step Over Obstacle  Is able to step over one shoe box (4.5 in total height) without changing gait speed. No evidence of imbalance.    Gait with Narrow Base of Support  Ambulates 4-7 steps.    Gait with Eyes Closed  Walks 20 ft, uses assistive device, slower speed, mild gait deviations, deviates 6-10 in outside 12 in walkway width. Ambulates 20 ft in less than 9 sec but greater than 7 sec.    Ambulating Backwards  Walks 20 ft, uses assistive device, slower speed, mild gait deviations, deviates 6-10 in outside 12 in walkway width.    Steps  Alternating feet, must use rail.    Total Score  20                Objective measurements completed on examination: See above findings.              PT Education - 01/18/19 0943    Education Details  HEP, exam findings, POC    Person(s) Educated  Patient    Methods  Explanation;Demonstration;Verbal cues;Handout    Comprehension  Verbalized understanding;Need further instruction          PT Long Term Goals - 01/18/19 0949      PT LONG TERM GOAL #1   Title  Pt will be I and compliant with HEP. (Target for all goals 02/22/19)    Status  New      PT LONG TERM GOAL #2   Title  Pt will improve 5TSTS test to less than 13 seconds to show improved leg strength and balance    Status  New  PT LONG TERM GOAL #3   Title  Pt will improve FGA to at least 24/30 to show  improved balance    Baseline  20    Status  New      PT LONG TERM GOAL #4   Title  Pt will improve Rt arm/leg strength to overall 4+/5 MMT to improve function    Status  New      PT LONG TERM GOAL #5   Title  Pt will improve gait speed to >2.8 ft/sec and ambulate at least 1000 ft independently  to show community ambulation.    Status  New             Plan - 01/18/19 0944    Clinical Impression Statement  Pt presents with Ischemic stroke on 01/11/19 with mild Rt hemiparesis. MRI confirmed acute/subacute nonhemorrhagic infarct involving posterior left lentiform nucleus and left occipital lobe. She has overall decresaed Rt sided strength, decreased Rt sided coordination,decreased endurance, difficulty with gait, and mild balance deficits. She will benenfit from skilled PT to address her deficits.    Personal Factors and Comorbidities  Comorbidity 1;Comorbidity 2;Comorbidity 3+    Comorbidities  CVA,hypertension and type 2 diabetes mellitus    Examination-Activity Limitations  Carry;Stairs;Stand;Lift;Squat;Sit;Locomotion Level    Examination-Participation Restrictions  Meal Prep;Cleaning;Community Activity;Driving;Shop;Laundry    Stability/Clinical Decision Making  Evolving/Moderate complexity    Clinical Decision Making  Moderate    Rehab Potential  Good    PT Frequency  2x / week   1-2   PT Duration  4 weeks    PT Treatment/Interventions  ADLs/Self Care Home Management;Aquatic Therapy;Electrical Stimulation;Moist Heat;Gait training;Stair training;Functional mobility training;Therapeutic activities;Therapeutic exercise;Balance training;Neuromuscular re-education;Manual techniques;Passive range of motion;Energy conservation    PT Next Visit Plan  review and update HEP PRN, needs higher level gait/balance challenges, Rt sided strength, endurance    PT Home Exercise Plan  Access Code: [email or web address]    Consulted and Agree with Plan of Care  Patient       Patient will benefit from skilled  therapeutic intervention in order to improve the following deficits and impairments:  Abnormal gait, Decreased activity tolerance, Decreased balance, Decreased coordination, Decreased endurance, Decreased strength, Difficulty walking  Visit Diagnosis: Hemiplegia and hemiparesis following cerebral infarction affecting right non-dominant side (HCC)  Other abnormalities of gait and mobility     Problem List Patient Active Problem List   Diagnosis Date Noted  . Ischemic stroke ([place]) 01/13/2019  . Hyponatremia 01/13/2019  . Diabetes mellitus type II, non insulin dependent ([place]) 10/19/2018  . HTN (hypertension) 10/19/2018  . HLD (hyperlipidemia) 10/19/2018  . Palpitations 10/19/2018    Silvestre Mesi 01/18/2019, 9:54 AM  [facility] [address] [place], [place], [address] Phone: [phone]   Fax:  [phone]  Name: Carrie [name] MRN: [id number] Date of Birth: September 12, 1945

## 2019-01-18 NOTE — Patient Instructions (Signed)
Access Code: [email or web address]  URL: [email or web address]  Date: 01/18/2019  Prepared by: Elsie Ra   Exercises  Backward Walking with Counter Support - 3-5 reps - 1 sets - 2x daily - 6x weekly  Sit to Stand without Arm Support - 10 reps - 1-2 sets - 2x daily - 6x weekly  Tandem Walking with Counter Support - 3 sets - 2x daily - 6x weekly  Standing Marching - 10 reps - 1-3 sets - 2x daily - 6x weekly  Standing Hip Abduction with Counter Support - 10 reps - 3 sets - 2x daily - 6x weekly  Seated Chair Push Ups - 10 reps - 3 sets - 2x daily - 6x weekly  Wall Push Up - 10 reps - 3 sets - 2x daily - 6x weekly  Standing Shoulder Flexion with Resistance - 10 reps - 1-2 sets - 2x daily - 6x weekly  Standing Single Arm Shoulder Abduction with Resistance - 10 reps - 1-2 sets - 2x daily - 6x weekly

## 2019-01-22 DIAGNOSIS — I639 Cerebral infarction, unspecified: Secondary | ICD-10-CM | POA: Diagnosis not present

## 2019-01-22 DIAGNOSIS — I10 Essential (primary) hypertension: Secondary | ICD-10-CM | POA: Diagnosis not present

## 2019-01-22 DIAGNOSIS — G459 Transient cerebral ischemic attack, unspecified: Secondary | ICD-10-CM | POA: Diagnosis not present

## 2019-01-23 ENCOUNTER — Ambulatory Visit: Payer: Medicare Other | Admitting: Physical Therapy

## 2019-01-24 ENCOUNTER — Ambulatory Visit: Payer: Medicare Other | Admitting: Neurology

## 2019-01-24 ENCOUNTER — Encounter: Payer: Self-pay | Admitting: Neurology

## 2019-01-24 ENCOUNTER — Other Ambulatory Visit: Payer: Self-pay

## 2019-01-24 VITALS — BP 132/73 | HR 69 | Temp 97.6°F | Ht 61.0 in | Wt 139.0 lb

## 2019-01-24 DIAGNOSIS — I639 Cerebral infarction, unspecified: Secondary | ICD-10-CM | POA: Diagnosis not present

## 2019-01-24 MED ORDER — CLOPIDOGREL BISULFATE 75 MG PO TABS: 75.0000 mg | ORAL_TABLET | Freq: Every day | ORAL | 4 refills | Status: AC

## 2019-01-24 NOTE — Progress Notes (Signed)
PATIENT: Carrie [name] DOB: July 05, 1945  Chief Complaint  Patient presents with  . Cerebrovascular Accident    Follow up from hospital admission on 01/12/2019.  She is taking aspirin 81mg  daily and has been prescribed Plavix 75mg  daily for 21 days.  She still having mild, right-sided weakness.  She is currently in PT and is scheduled to go twice weekly for four weeks.  She is currenlty being treated for allergies that has caused some headaches.  She recently started Zyrtec.    Marland Kitchen PCP    Jani Gravel, MD - referred from hospital     [place] is a 73 year old female, seen in request by her primary care physician Dr. Jani Gravel, for treatment of stroke, initial evaluation was on January 24, 2019.  I have reviewed and summarized the referring note from the referring physician.  She had a past medical history of hyperlipidemia, hypertension, diabetes, she was admitted to the hospital on January 12, 2019 for acute onset of dysarthria, right and lower extremity weakness January 11, 2019.    I personally reviewed CT head, MRI of the brain, acute infarction in the posterior limb of left lentiform nucleus, and the left occipital lobe, moderate supratentorium small vessel disease.  CT angiogram of head and neck showed no large vessel disease.  Patient symptoms quickly resolved within 1 hour Echocardiogram showed normal ejection fraction, no significant valvular disease  Laboratory evaluation A1c 7.4, LDL 129, she was on aspirin 81 mg daily, now she is discharged with combination of aspirin 81 mg plus Plavix 75 mg daily EEG was normal on Jan 13 2019.  REVIEW OF SYSTEMS: Full 14 system review of systems performed and notable only for as above All other review of systems were negative.  ALLERGIES: Allergies  Allergen Reactions  . Penicillins Hives    HOME MEDICATIONS: Current Outpatient Medications  Medication Sig Dispense Refill  . amLODipine (NORVASC) 10 MG tablet  Take 5 mg by mouth daily.     Marland Kitchen aspirin EC 81 MG EC tablet Take 1 tablet (81 mg total) by mouth daily. 30 tablet 0  . atorvastatin (LIPITOR) 40 MG tablet Take 1 tablet (40 mg total) by mouth daily at 6 PM. 30 tablet 0  . cetirizine (ZYRTEC) 10 MG tablet Take 10 mg by mouth at bedtime.    . cloNIDine (CATAPRES) 0.2 MG tablet Take 1 tablet (0.2 mg total) by mouth 2 (two) times daily. 180 tablet 1  . clopidogrel (PLAVIX) 75 MG tablet Take 1 tablet (75 mg total) by mouth daily for 21 days. 21 tablet 0  . diazepam (VALIUM) 5 MG tablet Take 5 mg by mouth every 6 (six) hours as needed for anxiety.     . empagliflozin (JARDIANCE) 25 MG TABS tablet Take 25 mg by mouth daily.    Marland Kitchen esomeprazole (NEXIUM) 40 MG capsule Take 40 mg by mouth daily as needed (for acid reflux).     Marland Kitchen glimepiride (AMARYL) 1 MG tablet Take 1 mg by mouth daily.    . hydrochlorothiazide (HYDRODIURIL) 25 MG tablet Take 25 mg by mouth daily.    . Iron-FA-B Cmp-C-Biot-Probiotic (FUSION PLUS) CAPS Take 1 capsule by mouth daily.    . metoprolol (TOPROL-XL) 100 MG 24 hr tablet Take 100 mg by mouth daily.      . sitaGLIPtin-metformin (JANUMET) 50-1000 MG tablet Take 1 tablet by mouth 2 (two) times daily with a meal.    . spironolactone (ALDACTONE) 25 MG tablet Take  25 mg by mouth daily.    Marland Kitchen telmisartan (MICARDIS) 40 MG tablet Take 20 mg by mouth daily.      No current facility-administered medications for this visit.     PAST MEDICAL HISTORY: Past Medical History:  Diagnosis Date  . Anemia   . Diabetes mellitus   . DM (diabetes mellitus) ([place]) 10/19/2018  . HLD (hyperlipidemia) 10/19/2018  . HTN (hypertension) 10/19/2018  . Hypertension   . Palpitations 10/19/2018  . Stroke [facility])     PAST SURGICAL HISTORY: Past Surgical History:  Procedure Laterality Date  . ABDOMINAL HYSTERECTOMY     1993  . CESAREAN SECTION      FAMILY HISTORY: Family History  Problem Relation Age of Onset  . Hypertension Father   . Diabetes Mother   .  Hypertension Mother   . Diabetes Brother   . Diabetes Sister     SOCIAL HISTORY: Social History   Socioeconomic History  . Marital status: Married    Spouse name: Not on file  . Number of children: 0  . Years of education: college  . Highest education level: Not on file  Occupational History  . Not on file  Social Needs  . Financial resource strain: Not on file  . Food insecurity    Worry: Not on file    Inability: Not on file  . Transportation needs    Medical: Not on file    Non-medical: Not on file  Tobacco Use  . Smoking status: Never Smoker  . Smokeless tobacco: Never Used  Substance and Sexual Activity  . Alcohol use: No  . Drug use: No  . Sexual activity: Yes    Birth control/protection: Surgical  Lifestyle  . Physical activity    Days per week: Not on file    Minutes per session: Not on file  . Stress: Not on file  Relationships  . Social Herbalist on phone: Not on file    Gets together: Not on file    Attends religious service: Not on file    Active member of club or organization: Not on file    Attends meetings of clubs or organizations: Not on file    Relationship status: Not on file  . Intimate partner violence    Fear of current or ex partner: Not on file    Emotionally abused: Not on file    Physically abused: Not on file    Forced sexual activity: Not on file  Other Topics Concern  . Not on file  Social History Narrative   Lives at home with her husband.   Three cups caffeine per day.   Left-handed.     PHYSICAL EXAM   Vitals:   01/24/19 1044  BP: 132/73  Pulse: 69  Temp: 97.6 F (36.4 C)  Weight: 139 lb (63 kg)  Height: 5\' 1"  (1.549 m)    Not recorded      Body mass index is 26.26 kg/m.  PHYSICAL EXAMNIATION:  Gen: NAD, conversant, well nourised, well groomed                     Cardiovascular: Regular rate rhythm, no peripheral edema, warm, nontender. Eyes: Conjunctivae clear without exudates or hemorrhage  Neck: Supple, no carotid bruits. Pulmonary: Clear to auscultation bilaterally   NEUROLOGICAL EXAM:  MENTAL STATUS: Speech:    Speech is normal; fluent and spontaneous with normal comprehension.  Cognition:     Orientation to time, place and  person     Normal recent and remote memory     Normal Attention span and concentration     Normal Language, naming, repeating,spontaneous speech     Fund of knowledge   CRANIAL NERVES: CN II: Visual fields are full to confrontation.  Pupils are round equal and briskly reactive to light. CN III, IV, VI: extraocular movement are normal. No ptosis. CN V: Facial sensation is intact to pinprick in all 3 divisions bilaterally. Corneal responses are intact.  CN VII: Face is symmetric with normal eye closure and smile. CN VIII: Hearing is normal to causal conversation. CN IX, X: Palate elevates symmetrically. Phonation is normal. CN XI: Head turning and shoulder shrug are intact CN XII: Tongue is midline with normal movements and no atrophy.  MOTOR: Mild fixation of right upper extremity upon rapid rotating movement, mild right upper extremity pronation drift, mild drift of right lower extremity  REFLEXES: Reflexes are 2+ and symmetric at the biceps, triceps, knees, and ankles. Plantar responses are flexor.  SENSORY: Intact to light touch, pinprick, positional sensation and vibratory sensation are intact in fingers and toes.  COORDINATION: Rapid alternating movements and fine finger movements are intact. There is no dysmetria on finger-to-nose and heel-knee-shin.    GAIT/STANCE: Posture is normal. Gait is steady with normal steps, base, arm swing, and turning. Heel and toe walking are normal. Tandem gait is normal.  Romberg is absent.   DIAGNOSTIC DATA (LABS, IMAGING, TESTING) - I reviewed patient records, labs, notes, testing and imaging myself where available.   ASSESSMENT AND PLAN  Carrie [name] is a 73 y.o. female   Acute stroke  small vessel disease involving left lenticular nuclei, left occipital lobe,  In the territory of left posterior circulation  Vascular risk factor of aging, hypertension, hyperlipidemia, diabetes,  Multiple stroke in left posterior circulation, raised the possibility of embolic stroke,  Refer him to 30 days cardiac monitoring by cardiologist [name]  Plavix 71 mg daily stop aspirin  Marcial Pacas, M.D. Ph.D.  [facility] [address] [place], [place] [address] Ph: [phone] Fax: [phone]  CC: Jani Gravel, MD

## 2019-01-25 ENCOUNTER — Ambulatory Visit: Payer: Medicare Other | Admitting: Physical Therapy

## 2019-01-29 DIAGNOSIS — I10 Essential (primary) hypertension: Secondary | ICD-10-CM | POA: Diagnosis not present

## 2019-01-29 DIAGNOSIS — E118 Type 2 diabetes mellitus with unspecified complications: Secondary | ICD-10-CM | POA: Diagnosis not present

## 2019-01-29 DIAGNOSIS — I639 Cerebral infarction, unspecified: Secondary | ICD-10-CM | POA: Diagnosis not present

## 2019-01-30 ENCOUNTER — Other Ambulatory Visit: Payer: Self-pay

## 2019-01-30 ENCOUNTER — Ambulatory Visit: Payer: Medicare Other | Admitting: Physical Therapy

## 2019-01-30 DIAGNOSIS — I69353 Hemiplegia and hemiparesis following cerebral infarction affecting right non-dominant side: Secondary | ICD-10-CM | POA: Diagnosis not present

## 2019-01-30 DIAGNOSIS — R2689 Other abnormalities of gait and mobility: Secondary | ICD-10-CM | POA: Diagnosis not present

## 2019-01-30 NOTE — Therapy (Signed)
[facility] [address] [place], [place], [address] Phone: [phone]   Fax:  [phone]  Physical Therapy Treatment  Patient Details  Name: Carrie [name] MRN: [id number] Date of Birth: Nov 06, 1945 Referring Provider (PT): Darliss Cheney, MD   Encounter Date: 01/30/2019  PT End of Session - 01/30/19 1056    Visit Number  2    Number of Visits  8    Date for PT Re-Evaluation  02/22/19    Authorization Type  [employer] MCR    PT Start Time  [id number]    PT Stop Time  1055    PT Time Calculation (min)  40 min    Activity Tolerance  Patient tolerated treatment well    Behavior During Therapy  [facility] for tasks assessed/performed       Past Medical History:  Diagnosis Date  . Anemia   . Diabetes mellitus   . DM (diabetes mellitus) ([place]) 10/19/2018  . HLD (hyperlipidemia) 10/19/2018  . HTN (hypertension) 10/19/2018  . Hypertension   . Palpitations 10/19/2018  . Stroke [facility])     Past Surgical History:  Procedure Laterality Date  . ABDOMINAL HYSTERECTOMY     1993  . CESAREAN SECTION      There were no vitals filed for this visit.  Subjective Assessment - 01/30/19 1034    Subjective  relays no new complaints today, feels she is improving with balance and her Rt arm by doing her HEP    Patient Stated Goals  I'm not sure, they just told me I needed to come    Currently in Pain?  No/denies                       [facility] Adult PT Treatment/Exercise - 01/30/19 0001      High Level Balance   High Level Balance Comments  gait in hall up/down X 2 ea for head turns latreal and up/down, tandem walk, retrowalk, grapevine, finished with SLS 10 sec avg for 5 reps on Rt side. Overall close supervision to [facility] for balance      Exercises   Exercises  Other Exercises    Other Exercises   sci fit bike for 5 min (started L4 but had to drop down a level each minute due to fatigue) supine SLR Rt side 2X10, bridges 2X10, SL clams for  her Rt side 2X10, seated bicep curls 3 lbs 2X10 bilat, shoulder flexion 2 lbs one set of 10, one set of 5 bilat, shoulder abd 1 lbs 2X10 bilat. Sit to stands no UE support X 10                  PT Long Term Goals - 01/18/19 0949      PT LONG TERM GOAL #1   Title  Pt will be I and compliant with HEP. (Target for all goals 02/22/19)    Status  New      PT LONG TERM GOAL #2   Title  Pt will improve 5TSTS test to less than 13 seconds to show improved leg strength and balance    Status  New      PT LONG TERM GOAL #3   Title  Pt will improve FGA to at least 24/30 to show improved balance    Baseline  20    Status  New      PT LONG TERM GOAL #4   Title  Pt will improve Rt arm/leg strength  to overall 4+/5 MMT to improve function    Status  New      PT LONG TERM GOAL #5   Title  Pt will improve gait speed to >2.8 ft/sec and ambulate at least 1000 ft independently  to show community ambulation.    Status  New            Plan - 01/30/19 1056    Clinical Impression Statement  Session focused on overall Rt sided leg and arm strength with progression of dyanamic balance exercises. Overall she requires CGA to supervision with balance activities. PT will continue to progress as able.    Personal Factors and Comorbidities  Comorbidity 1;Comorbidity 2;Comorbidity 3+    Comorbidities  CVA,hypertension and type 2 diabetes mellitus    Examination-Activity Limitations  Carry;Stairs;Stand;Lift;Squat;Sit;Locomotion Level    Examination-Participation Restrictions  Meal Prep;Cleaning;Community Activity;Driving;Shop;Laundry    Stability/Clinical Decision Making  Evolving/Moderate complexity    Rehab Potential  Good    PT Frequency  2x / week   1-2   PT Duration  4 weeks    PT Treatment/Interventions  ADLs/Self Care Home Management;Aquatic Therapy;Electrical Stimulation;Moist Heat;Gait training;Stair training;Functional mobility training;Therapeutic activities;Therapeutic exercise;Balance  training;Neuromuscular re-education;Manual techniques;Passive range of motion;Energy conservation    PT Next Visit Plan  any soreness?  needs higher level gait/balance challenges, Rt sided strength, endurance    PT Home Exercise Plan  Access Code: [email or web address]    Consulted and Agree with Plan of Care  Patient       Patient will benefit from skilled therapeutic intervention in order to improve the following deficits and impairments:  Abnormal gait, Decreased activity tolerance, Decreased balance, Decreased coordination, Decreased endurance, Decreased strength, Difficulty walking  Visit Diagnosis: Hemiplegia and hemiparesis following cerebral infarction affecting right non-dominant side (HCC)  Other abnormalities of gait and mobility     Problem List Patient Active Problem List   Diagnosis Date Noted  . Cerebrovascular accident (CVA) ([place]) 01/24/2019  . Ischemic stroke ([place]) 01/13/2019  . Hyponatremia 01/13/2019  . Diabetes mellitus type II, non insulin dependent ([place]) 10/19/2018  . HTN (hypertension) 10/19/2018  . HLD (hyperlipidemia) 10/19/2018  . Palpitations 10/19/2018    Carrie [name] 01/30/2019, 10:58 AM  [facility] [address] [place], [place], [address] Phone: [phone]   Fax:  [phone]  Name: Carrie [name] MRN: [id number] Date of Birth: 19-Aug-1945

## 2019-02-01 ENCOUNTER — Other Ambulatory Visit: Payer: Self-pay

## 2019-02-01 ENCOUNTER — Ambulatory Visit: Payer: Medicare Other | Admitting: Physical Therapy

## 2019-02-01 DIAGNOSIS — I69353 Hemiplegia and hemiparesis following cerebral infarction affecting right non-dominant side: Secondary | ICD-10-CM

## 2019-02-01 DIAGNOSIS — R2689 Other abnormalities of gait and mobility: Secondary | ICD-10-CM | POA: Diagnosis not present

## 2019-02-01 NOTE — Therapy (Signed)
[facility] [address] [place], [place], [address] Phone: [phone]   Fax:  [phone]  Physical Therapy Treatment  Patient Details  Name: Carrie [name] MRN: [id number] Date of Birth: August 27, 1945 Referring Provider (PT): Darliss Cheney, MD   Encounter Date: 02/01/2019  PT End of Session - 02/01/19 1103    Visit Number  3    Number of Visits  8    Date for PT Re-Evaluation  02/22/19    Authorization Type  UHC MCR    PT Start Time  [id number]    PT Stop Time  1055    PT Time Calculation (min)  40 min    Activity Tolerance  Patient tolerated treatment well    Behavior During Therapy  [facility] for tasks assessed/performed       Past Medical History:  Diagnosis Date  . Anemia   . Diabetes mellitus   . DM (diabetes mellitus) ([place]) 10/19/2018  . HLD (hyperlipidemia) 10/19/2018  . HTN (hypertension) 10/19/2018  . Hypertension   . Palpitations 10/19/2018  . Stroke [facility])     Past Surgical History:  Procedure Laterality Date  . ABDOMINAL HYSTERECTOMY     1993  . CESAREAN SECTION      There were no vitals filed for this visit.  Subjective Assessment - 02/01/19 1043    Subjective  relays when she left PT last she went to get some lunch and she felt like a piece of food went down the wrong tube. She says she had trouble breathing at first and pain and could not hardly eat anything yesterday, able to eat some today and overall feels better, denies fever or illness (she was encouraged to watch out for that as she may have aspirated and if she starts feeling sick to follow up with MD to rule out PNA)    Patient Stated Goals  I'm not sure, they just told me I needed to come    Currently in Pain?  No/denies                       [facility] Adult PT Treatment/Exercise - 02/01/19 0001      High Level Balance   High Level Balance Comments  balance on airex feet together, then feet apart with head turns vetical and lateral,  then feet apart with eyes closed, then step ups onto airex pad and back off for fwd and lateral  X10 ea no UE support      Exercises   Other Exercises   bilat leg press 50 lbs X 10, then dropped to 30 lbs for Rt leg push only focus on eccentric control X 10 reps ea. Seated bicep curls 2 lbs 2X20, seated shoulder flexion 2 lbs 3X10, standing shoulder abd 1 lb 3X10 (all bilat). UE weight shifts at counter in plank postion reaching up into flexion X10 both sides                  PT Long Term Goals - 01/18/19 0949      PT LONG TERM GOAL #1   Title  Pt will be I and compliant with HEP. (Target for all goals 02/22/19)    Status  New      PT LONG TERM GOAL #2   Title  Pt will improve 5TSTS test to less than 13 seconds to show improved leg strength and balance    Status  New      PT  LONG TERM GOAL #3   Title  Pt will improve FGA to at least 24/30 to show improved balance    Baseline  20    Status  New      PT LONG TERM GOAL #4   Title  Pt will improve Rt arm/leg strength to overall 4+/5 MMT to improve function    Status  New      PT LONG TERM GOAL #5   Title  Pt will improve gait speed to >2.8 ft/sec and ambulate at least 1000 ft independently  to show community ambulation.    Status  New            Plan - 02/01/19 1103    Clinical Impression Statement  Progressed Rt arm/leg strength and added standing on foam surfaces to increaese balance challengs. She had good tolerance to exercises. Continue POC    Personal Factors and Comorbidities  Comorbidity 1;Comorbidity 2;Comorbidity 3+    Comorbidities  CVA,hypertension and type 2 diabetes mellitus    Examination-Activity Limitations  Carry;Stairs;Stand;Lift;Squat;Sit;Locomotion Level    Examination-Participation Restrictions  Meal Prep;Cleaning;Community Activity;Driving;Shop;Laundry    Stability/Clinical Decision Making  Evolving/Moderate complexity    Rehab Potential  Good    PT Frequency  2x / week   1-2   PT Duration   4 weeks    PT Treatment/Interventions  ADLs/Self Care Home Management;Aquatic Therapy;Electrical Stimulation;Moist Heat;Gait training;Stair training;Functional mobility training;Therapeutic activities;Therapeutic exercise;Balance training;Neuromuscular re-education;Manual techniques;Passive range of motion;Energy conservation    PT Next Visit Plan  any soreness?  needs higher level gait/balance challenges, Rt sided strength, endurance    PT Home Exercise Plan  Access Code: [email or web address]    Consulted and Agree with Plan of Care  Patient       Patient will benefit from skilled therapeutic intervention in order to improve the following deficits and impairments:  Abnormal gait, Decreased activity tolerance, Decreased balance, Decreased coordination, Decreased endurance, Decreased strength, Difficulty walking  Visit Diagnosis: Hemiplegia and hemiparesis following cerebral infarction affecting right non-dominant side (HCC)  Other abnormalities of gait and mobility     Problem List Patient Active Problem List   Diagnosis Date Noted  . Cerebrovascular accident (CVA) ([place]) 01/24/2019  . Ischemic stroke ([place]) 01/13/2019  . Hyponatremia 01/13/2019  . Diabetes mellitus type II, non insulin dependent ([place]) 10/19/2018  . HTN (hypertension) 10/19/2018  . HLD (hyperlipidemia) 10/19/2018  . Palpitations 10/19/2018    Silvestre Mesi 02/01/2019, 11:04 AM  [facility] [address] [place], [place], [address] Phone: [phone]   Fax:  [phone]  Name: Carrie [name] MRN: [id number] Date of Birth: 1945/09/14

## 2019-02-06 ENCOUNTER — Ambulatory Visit: Payer: Medicare Other | Admitting: Physical Therapy

## 2019-02-13 ENCOUNTER — Other Ambulatory Visit: Payer: Self-pay

## 2019-02-13 ENCOUNTER — Ambulatory Visit: Payer: Medicare Other | Attending: Internal Medicine | Admitting: Physical Therapy

## 2019-02-13 DIAGNOSIS — I69353 Hemiplegia and hemiparesis following cerebral infarction affecting right non-dominant side: Secondary | ICD-10-CM | POA: Insufficient documentation

## 2019-02-13 DIAGNOSIS — R2689 Other abnormalities of gait and mobility: Secondary | ICD-10-CM | POA: Insufficient documentation

## 2019-02-13 NOTE — Therapy (Signed)
[facility] [address] [place], [place], [address] Phone: [phone]   Fax:  [phone]  Physical Therapy Treatment  Patient Details  Name: Carrie [name] MRN: [id number] Date of Birth: [birth year] Referring Provider (PT): Darliss Cheney, MD   Encounter Date: 02/13/2019  PT End of Session - 02/13/19 1212    Visit Number  4    Number of Visits  8    Date for PT Re-Evaluation  02/22/19    Authorization Type  UHC MCR    PT Start Time  1020    PT Stop Time  1100    PT Time Calculation (min)  40 min    Equipment Utilized During Treatment  Gait belt    Activity Tolerance  Patient tolerated treatment well    Behavior During Therapy  [facility] for tasks assessed/performed       Past Medical History:  Diagnosis Date  . Anemia   . Diabetes mellitus   . DM (diabetes mellitus) ([place]) 10/19/2018  . HLD (hyperlipidemia) 10/19/2018  . HTN (hypertension) 10/19/2018  . Hypertension   . Palpitations 10/19/2018  . Stroke [facility])     Past Surgical History:  Procedure Laterality Date  . ABDOMINAL HYSTERECTOMY     1993  . CESAREAN SECTION      There were no vitals filed for this visit.  Subjective Assessment - 02/13/19 1022    Subjective  states that she has been taking Zyrtec recently and it made her dizzy after having a "sinus attack". not feeling dizzy today. stopped taking Zyrtec yesterday. has been doing her exercises at home and states that sometimes she feels imbalanced.    Patient Stated Goals  I'm not sure, they just told me I needed to come    Currently in Pain?  No/denies                   [facility] Adult PT Treatment/Exercise - 02/13/19 0001      Ambulation/Gait   Ambulation/Gait  Yes  (Pended)     Ambulation/Gait Assistance  5: Supervision  (Pended)     Ambulation/Gait Assistance Details  asking pt to speed up gait speed, abrupt stops, head turns/nods and scanning environment, no LOB  (Pended)     Ambulation  Distance (Feet)  230 Feet  (Pended)     Assistive device  None  (Pended)     Gait Pattern  Decreased arm swing - right;Decreased step length - right;Decreased step length - left;Decreased hip/knee flexion - right;Decreased hip/knee flexion - left  (Pended)     Ambulation Surface  Level;Indoor  (Pended)             NMR: NMR in // bars:  On Rocker board: -A/P 1x 15 reps with no UE support, cues for hip strategy  -eyes open head turns, head nods 3 x 5 reps each  On Foam:  -eyes closed wider BOS to more narrow BOS, multiple reps of 20-30 seconds  -narrower BOS with eyes closed 2 x 5 reps each head turns/head nods -standing marching focus on slow control 2 x 10 reps  -standing marching with alternating UE lift, pt difficulty sequencing movement, needing min A for balance  On blue foam beam:  -tandem walking down and back 3 reps with intermittent UE support -mini squat side stepping down and back 2 reps           PT Long Term Goals - 01/18/19 0949      PT  LONG TERM GOAL #1   Title  Pt will be I and compliant with HEP. (Target for all goals 02/22/19)    Status  New      PT LONG TERM GOAL #2   Title  Pt will improve 5TSTS test to less than 13 seconds to show improved leg strength and balance    Status  New      PT LONG TERM GOAL #3   Title  Pt will improve FGA to at least 24/30 to show improved balance    Baseline  20    Status  New      PT LONG TERM GOAL #4   Title  Pt will improve Rt arm/leg strength to overall 4+/5 MMT to improve function    Status  New      PT LONG TERM GOAL #5   Title  Pt will improve gait speed to >2.8 ft/sec and ambulate at least 1000 ft independently  to show community ambulation.    Status  New            Plan - 02/13/19 1213    Clinical Impression Statement  Progressed standing balance on compliant surfaces with head turns, side stepping, and tandem balance. Pt with improved ability to perform eyes closed balance with multiple  repetitions. Pt with increased difficulty performing reciprocating UE and LE movement while standing on foam. No reports of dizziness throughout today's session. Will continue to progress towards LTGs.    Personal Factors and Comorbidities  Comorbidity 1;Comorbidity 2;Comorbidity 3+    Comorbidities  CVA,hypertension and type 2 diabetes mellitus    Examination-Activity Limitations  Carry;Stairs;Stand;Lift;Squat;Sit;Locomotion Level    Examination-Participation Restrictions  Meal Prep;Cleaning;Community Activity;Driving;Shop;Laundry    Stability/Clinical Decision Making  Evolving/Moderate complexity    Rehab Potential  Good    PT Frequency  2x / week   1-2   PT Duration  4 weeks    PT Treatment/Interventions  ADLs/Self Care Home Management;Aquatic Therapy;Electrical Stimulation;Moist Heat;Gait training;Stair training;Functional mobility training;Therapeutic activities;Therapeutic exercise;Balance training;Neuromuscular re-education;Manual techniques;Passive range of motion;Energy conservation    PT Next Visit Plan  check LTGs, re-cert vs. D/c. upgrade HEP as needed    PT Home Exercise Plan  Access Code: [email or web address]    Consulted and Agree with Plan of Care  Patient       Patient will benefit from skilled therapeutic intervention in order to improve the following deficits and impairments:  Abnormal gait, Decreased activity tolerance, Decreased balance, Decreased coordination, Decreased endurance, Decreased strength, Difficulty walking  Visit Diagnosis: Hemiplegia and hemiparesis following cerebral infarction affecting right non-dominant side (HCC)  Other abnormalities of gait and mobility     Problem List Patient Active Problem List   Diagnosis Date Noted  . Cerebrovascular accident (CVA) ([place]) 01/24/2019  . Ischemic stroke ([place]) 01/13/2019  . Hyponatremia 01/13/2019  . Diabetes mellitus type II, non insulin dependent ([place]) 10/19/2018  . HTN (hypertension) 10/19/2018  . HLD  (hyperlipidemia) 10/19/2018  . Palpitations 10/19/2018    Arliss Journey, PT, DPT  02/13/2019, 12:15 PM  [facility] [address] [place], [place], [address] Phone: [phone]   Fax:  [phone]  Name: Carrie [name] MRN: [id number] Date of Birth: 1945-03-30

## 2019-02-15 ENCOUNTER — Other Ambulatory Visit: Payer: Self-pay

## 2019-02-15 ENCOUNTER — Ambulatory Visit: Payer: Medicare Other | Admitting: Physical Therapy

## 2019-02-15 VITALS — BP 125/69 | HR 73

## 2019-02-15 DIAGNOSIS — R2689 Other abnormalities of gait and mobility: Secondary | ICD-10-CM | POA: Diagnosis not present

## 2019-02-15 DIAGNOSIS — I69353 Hemiplegia and hemiparesis following cerebral infarction affecting right non-dominant side: Secondary | ICD-10-CM

## 2019-02-15 NOTE — Patient Instructions (Signed)
Access Code: [email or web address]  URL: [email or web address]  Date: 02/15/2019  Prepared by: Janann August   Exercises Backward Walking with Counter Support - 3-5 reps - 1 sets - 2x daily - 6x weekly Sit to Stand without Arm Support - 10 reps - 1-2 sets - 2x daily - 6x weekly Tandem Walking with Counter Support - 3 sets - 2x daily - 6x weekly Standing Marching - 10 reps - 1-3 sets - 2x daily - 6x weekly Standing Hip Abduction with Counter Support - 10 reps - 3 sets - 2x daily - 6x weekly Seated Chair Push Ups - 10 reps - 3 sets - 2x daily - 6x weekly Wall Push Up - 10 reps - 3 sets - 2x daily - 6x weekly Standing Shoulder Flexion with Resistance - 10 reps - 1-2 sets - 2x daily - 6x weekly Standing Single Arm Shoulder Abduction with Resistance - 10 reps - 1-2 sets - 2x daily - 6x weekly Romberg Stance Eyes Closed on Foam Pad - 3 sets - 30 hold - 2x daily - 7x weekly Romberg Stance on Foam Pad with Head Rotation - 10 reps - 2 sets - 1x daily - 7x weekly Tandem Stance - 3 sets - 30 hold - 1x daily - 7x weekly

## 2019-02-15 NOTE — Therapy (Signed)
[facility] [address] [place], [place], [address] Phone: [phone]   Fax:  [phone]  Physical Therapy Treatment/Discharge Summary  Patient Details  Name: Carrie [name] MRN: [id number] Date of Birth: [birth year] Referring Provider (PT): Darliss Cheney, MD   Encounter Date: 02/15/2019  PT End of Session - 02/15/19 1234    Visit Number  5    Number of Visits  8    Date for PT Re-Evaluation  02/22/19    Authorization Type  UHC MCR    PT Start Time  1017    PT Stop Time  1100    PT Time Calculation (min)  43 min    Equipment Utilized During Treatment  Gait belt    Activity Tolerance  Patient tolerated treatment well    Behavior During Therapy  [facility] for tasks assessed/performed       Past Medical History:  Diagnosis Date  . Anemia   . Diabetes mellitus   . DM (diabetes mellitus) ([place]) 10/19/2018  . HLD (hyperlipidemia) 10/19/2018  . HTN (hypertension) 10/19/2018  . Hypertension   . Palpitations 10/19/2018  . Stroke [facility])     Past Surgical History:  Procedure Laterality Date  . ABDOMINAL HYSTERECTOMY     1993  . CESAREAN SECTION      Vitals:   02/15/19 1026  BP: 125/69  Pulse: 73    Subjective Assessment - 02/15/19 1019    Subjective  Noticed her movement was better this morning. No dizziness. No falls. Has been doing the exercises at home.    Patient Stated Goals  I'm not sure, they just told me I needed to come    Currently in Pain?  No/denies         [facility] PT Assessment - 02/15/19 1029      Standardized Balance Assessment   Five times sit to stand comments   12.53 seconds without UE support      Functional Gait  Assessment   Gait assessed   Yes    Gait Level Surface  Walks 20 ft in less than 7 sec but greater than 5.5 sec, uses assistive device, slower speed, mild gait deviations, or deviates 6-10 in outside of the 12 in walkway width.   5.81 seoncds   Change in Gait Speed  Able to smoothly  change walking speed without loss of balance or gait deviation. Deviate no more than 6 in outside of the 12 in walkway width.    Gait with Horizontal Head Turns  Performs head turns smoothly with slight change in gait velocity (eg, minor disruption to smooth gait path), deviates 6-10 in outside 12 in walkway width, or uses an assistive device.    Gait with Vertical Head Turns  Performs head turns with no change in gait. Deviates no more than 6 in outside 12 in walkway width.    Gait and Pivot Turn  Pivot turns safely within 3 sec and stops quickly with no loss of balance.    Step Over Obstacle  Is able to step over 2 stacked shoe boxes taped together (9 in total height) without changing gait speed. No evidence of imbalance.    Gait with Narrow Base of Support  Ambulates 4-7 steps.    Gait with Eyes Closed  Walks 20 ft, slow speed, abnormal gait pattern, evidence for imbalance, deviates 10-15 in outside 12 in walkway width. Requires more than 9 sec to ambulate 20 ft.   11.15 seconds   Ambulating  Backwards  Walks 20 ft, no assistive devices, good speed, no evidence for imbalance, normal gait    Steps  Alternating feet, must use rail.    Total Score  23    FGA comment:  23/30                 Access Code: [email or web address]  URL: [email or web address]  Date: 02/20/2019  Prepared by: Janann August   Verbally reviewed pt's initial HEP as well as added new additions to further address balance with eyes closed and head turns.   Exercises Backward Walking with Counter Support - 3-5 reps - 1 sets - 2x daily - 6x weekly Sit to Stand without Arm Support - 10 reps - 1-2 sets - 2x daily - 6x weekly Tandem Walking with Counter Support - 3 sets - 2x daily - 6x weekly Standing Marching - 10 reps - 1-3 sets - 2x daily - 6x weekly Standing Hip Abduction with Counter Support - 10 reps - 3 sets - 2x daily - 6x weekly Seated Chair Push Ups - 10 reps - 3 sets - 2x daily - 6x weekly Wall Push Up -  10 reps - 3 sets - 2x daily - 6x weekly Standing Shoulder Flexion with Resistance - 10 reps - 1-2 sets - 2x daily - 6x weekly Standing Single Arm Shoulder Abduction with Resistance - 10 reps - 1-2 sets - 2x daily - 6x weekly   New additions to HEP:  Romberg Stance Eyes Closed on Foam Pad - 3 sets - 30 hold - 2x daily - 7x weekly Romberg Stance on Foam Pad with Head Rotation - 10 reps - 2 sets - 1x daily - 7x weekly Tandem Stance - 3 sets - 30 hold - 1x daily - 7x weekly             PT Long Term Goals - 02/15/19 1022      PT LONG TERM GOAL #1   Title  Pt will be I and compliant with HEP. (Target for all goals 02/22/19)    Status  Achieved      PT LONG TERM GOAL #2   Title  Pt will improve 5TSTS test to less than 13 seconds to show improved leg strength and balance    Baseline  12.53 seconds without UE support on 02/15/19    Status  Achieved      PT LONG TERM GOAL #3   Title  Pt will improve FGA to at least 24/30 to show improved balance    Baseline  23/30 - but improvement from 20/30    Status  Not Met      PT LONG TERM GOAL #4   Title  Pt will improve Rt arm/leg strength to overall 4+/5 MMT to improve function    Baseline  4+/5 R LE    Status  Partially Met      PT LONG TERM GOAL #5   Title  Pt will improve gait speed to >2.8 ft/sec and ambulate at least 1000 ft independently  to show community ambulation.    Baseline  12.22 seconds = 2.68 ft/sec, did not have time to assess community ambulation goal - however pt is independent with all gait walking around therapy gym    Status  Partially Met           02/20/19 1009  Plan  Clinical Impression Statement Focus of today's skilled session was assessing pt's LTGs for D/C vs. re-cert. Pt has partially met 2  out of 5 goals, met 2 out of 5, and not met 1 out of 5 goals. 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 Pt able to ambulate  independently throughout therapy session, unable to test for 1,000 ft today due to time constraints. Pt improved her FGA score to a 23/30 (but not to goal of 24/30), that has improved from 20/30, with pt still have difficulty with tandem stance, eyes closed, and head turns. Pt has achieved her LTGs in regards to HEP and 5x sit <> stand time without UE support. Pt is pleased with her progress and is willing to be discharged from PT, added corner balance to HEP to address impairments listed above.  Personal Factors and Comorbidities Comorbidity 1;Comorbidity 2;Comorbidity 3+  Comorbidities CVA,hypertension and type 2 diabetes mellitus  Examination-Activity Limitations Carry;Stairs;Stand;Lift;Squat;Sit;Locomotion Level  Examination-Participation Restrictions Meal Prep;Cleaning;Community Activity;Driving;Shop;Laundry  Pt will benefit from skilled therapeutic intervention in order to improve on the following deficits Abnormal gait;Decreased activity tolerance;Decreased balance;Decreased coordination;Decreased endurance;Decreased strength;Difficulty walking  Stability/Clinical Decision Making Evolving/Moderate complexity  Rehab Potential Good  PT Frequency 2x / week (1-2)  PT Duration 4 weeks  PT Treatment/Interventions ADLs/Self Care Home Management;Aquatic Therapy;Electrical Stimulation;Moist Heat;Gait training;Stair training;Functional mobility training;Therapeutic activities;Therapeutic exercise;Balance training;Neuromuscular re-education;Manual techniques;Passive range of motion;Energy conservation  PT Next Visit Plan D/C  PT Home Exercise Plan Access Code: [email or web address] and Agree with Plan of Care Patient        PHYSICAL THERAPY DISCHARGE SUMMARY  Visits from Start of Care: 5  Current functional level related to goals / functional outcomes: See LTGs.    Remaining deficits: Gait with eyes closed.    Education / Equipment: HEP   Plan: Patient agrees to discharge.  Patient goals  were partially met. Patient is being discharged due to meeting the stated rehab goals.  ?????         Patient will benefit from skilled therapeutic intervention in order to improve the following deficits and impairments:     Visit Diagnosis: Hemiplegia and hemiparesis following cerebral infarction affecting right non-dominant side (HCC)  Other abnormalities of gait and mobility     Problem List Patient Active Problem List   Diagnosis Date Noted  . Cerebrovascular accident (CVA) ([place]) 01/24/2019  . Ischemic stroke ([place]) 01/13/2019  . Hyponatremia 01/13/2019  . Diabetes mellitus type II, non insulin dependent ([place]) 10/19/2018  . HTN (hypertension) 10/19/2018  . HLD (hyperlipidemia) 10/19/2018  . Palpitations 10/19/2018    Arliss Journey, PT, DPT  02/15/2019, 12:35 PM  [facility] [address] [place], [place], [address] Phone: [phone]   Fax:  [phone]  Name: Carrie [name] MRN: [id number] Date of Birth: [birth year]

## 2019-03-01 ENCOUNTER — Telehealth: Payer: Self-pay | Admitting: Neurology

## 2019-03-01 DIAGNOSIS — I639 Cerebral infarction, unspecified: Secondary | ICD-10-CM

## 2019-03-01 NOTE — Addendum Note (Signed)
Addended by: Noberto Retort C on: 03/01/2019 10:37 AM   Modules accepted: Orders

## 2019-03-01 NOTE — Telephone Encounter (Signed)
Pt is asking for a call to discuss her dosage of clopidogrel (PLAVIX) 75 MG tablet and her blood levels being checked and how often if should be.  Please call

## 2019-03-01 NOTE — Telephone Encounter (Signed)
I returned the call to the patient.  She thought Plavix would cause her to have to be checked like Coumadin.  She is aware that it does not require the same monitoring.  She should continue taking her Plavix 75mg  daily and keep her follow up here.  Also, she has not received a call for her 30-day event monitor.  She is aware we will ask our referral team to check on this for her.

## 2019-03-05 DIAGNOSIS — Z1231 Encounter for screening mammogram for malignant neoplasm of breast: Secondary | ICD-10-CM | POA: Diagnosis not present

## 2019-03-06 NOTE — Telephone Encounter (Signed)
This referral was re-faxed, received confirmation.

## 2019-03-07 NOTE — Telephone Encounter (Signed)
Carrie [name], I reviewed [name] note and she said:" Refer him to 30 days cardiac monitoring by cardiologist [name]". Looks like its just for the 30-day heart monitor

## 2019-03-07 NOTE — Telephone Encounter (Signed)
Carrie [name] from [facility] is calling and and she states that she needs clarification before scheduling patient, the referral shows 30 day event monitor but she states it also shows consultation and she needs to know if patient needs consultation before 30 day even monitor is scheduled  CB# [phone]

## 2019-03-12 NOTE — Telephone Encounter (Signed)
Called and spoke to [place] it will be [name] 30 days cardiac monitoring . [place] understood.

## 2019-03-13 ENCOUNTER — Other Ambulatory Visit: Payer: Self-pay

## 2019-03-13 ENCOUNTER — Ambulatory Visit: Payer: Medicare Other

## 2019-03-13 DIAGNOSIS — I639 Cerebral infarction, unspecified: Secondary | ICD-10-CM

## 2019-03-13 DIAGNOSIS — G459 Transient cerebral ischemic attack, unspecified: Secondary | ICD-10-CM | POA: Diagnosis not present

## 2019-04-11 DIAGNOSIS — H938X1 Other specified disorders of right ear: Secondary | ICD-10-CM | POA: Diagnosis not present

## 2019-04-12 ENCOUNTER — Ambulatory Visit: Payer: Medicare Other | Admitting: Cardiology

## 2019-04-24 DIAGNOSIS — E118 Type 2 diabetes mellitus with unspecified complications: Secondary | ICD-10-CM | POA: Diagnosis not present

## 2019-04-24 DIAGNOSIS — I10 Essential (primary) hypertension: Secondary | ICD-10-CM | POA: Diagnosis not present

## 2019-04-26 DIAGNOSIS — H25813 Combined forms of age-related cataract, bilateral: Secondary | ICD-10-CM | POA: Diagnosis not present

## 2019-04-26 DIAGNOSIS — Z794 Long term (current) use of insulin: Secondary | ICD-10-CM | POA: Diagnosis not present

## 2019-04-26 DIAGNOSIS — E1136 Type 2 diabetes mellitus with diabetic cataract: Secondary | ICD-10-CM | POA: Diagnosis not present

## 2019-04-30 ENCOUNTER — Telehealth: Payer: Self-pay | Admitting: Neurology

## 2019-04-30 ENCOUNTER — Other Ambulatory Visit: Payer: Self-pay

## 2019-04-30 NOTE — Patient Outreach (Signed)
[facility]) Care Management  04/30/2019  DAHLYA STALLONS [birth year] [id number]   Medication Adherence call to Mrs. Starbuck Compliant Voice message left with a call back number. Mr. Meskimen is showing past due on Janumet 50/1000 mg,Jardiance 25 mg and Telmisartan 40 mg under [facility].   [facility] Direct Dial [phone]  Fax [phone] [email or web address]

## 2019-04-30 NOTE — Telephone Encounter (Signed)
  Narrative & Impression    Event monitor 30 days 03/13/2019-04/11/2019: Symptoms of palpitations correlated with occasional PACs and one episode of 3 beat atrial tachycardia, otherwise no other significant arrhythmias, no atrial fibrillation or SVT or heart block.    Please call patient, 30 days cardiac event monitoring showed no evidence of atrial fibrillation, supraventricular tachycardia or heart block  Reported episode of palpitation correlated with occasional premature atrial contraction,  Continue with treatment plan as previously discussed

## 2019-04-30 NOTE — Progress Notes (Signed)
PATIENT: Carrie [name] DOB: 25-Sep-1945  Chief Complaint  Patient presents with  . Follow-up    3 mon f/u. Alone. Treatment room. Patient mentioned that she had some numbness to her left hand yesterday evening.      HISTORICAL   Update 04/30/2019: Carrie [name] is a 74 year old female who is being seen today for stroke follow-up.  Cardiac event monitor negative for atrial fibrillation.  Continues on Plavix  for secondary stroke prevention without side effects. She self discontinued atorvastatin due to c/o itching 2 hrs after taking dose and felt bumps under skin.  She has not been restarted on any cholesterol medication since that time but does state recent lab work done by PCP currently awaiting results.  Unable to personally review through epic.  Blood pressure today 116/70.  She does report last night having to lower episode of numbness/tingling sensation of left pointer finger and middle finger which resolved after several exercises.  She denies numbness/tingling elsewhere or any weakness or any other neurological symptoms.  Symptoms have not returned since that time.  No further concerns at this time.     Initial evaluation 01/24/2019 Dr. Krista Blue: Carrie [name] is a 74 year old female, seen in request by her primary care physician Dr. Jani Gravel, for treatment of stroke, initial evaluation was on January 24, 2019.  I have reviewed and summarized the referring note from the referring physician.  She had a past medical history of hyperlipidemia, hypertension, diabetes, she was admitted to the hospital on January 12, 2019 for acute onset of dysarthria, right and lower extremity weakness January 11, 2019.    I personally reviewed CT head, MRI of the brain, acute infarction in the posterior limb of left lentiform nucleus, and the left occipital lobe, moderate supratentorium small vessel disease.  CT angiogram of head and neck showed no large vessel disease.  Patient symptoms quickly  resolved within 1 hour Echocardiogram showed normal ejection fraction, no significant valvular disease  Laboratory evaluation A1c 7.4, LDL 129, she was on aspirin 81 mg daily, now she is discharged with combination of aspirin 81 mg plus Plavix 75 mg daily EEG was normal on Jan 13 2019.     REVIEW OF SYSTEMS: Full 14 system review of systems performed and notable only for as above All other review of systems were negative:  Numbness/tingling    ALLERGIES: Allergies  Allergen Reactions  . Lipitor [Atorvastatin] Hives    Patient reported that she broke out from in a rash   . Penicillins Hives    HOME MEDICATIONS: Current Outpatient Medications  Medication Sig Dispense Refill  . amLODipine (NORVASC) 10 MG tablet Take 5 mg by mouth as needed.     . cetirizine (ZYRTEC) 10 MG tablet Take 10 mg by mouth at bedtime as needed for allergies.     . cloNIDine (CATAPRES) 0.2 MG tablet Take 1 tablet (0.2 mg total) by mouth 2 (two) times daily. 180 tablet 1  . clopidogrel (PLAVIX) 75 MG tablet Take 1 tablet (75 mg total) by mouth daily. 90 tablet 4  . diazepam (VALIUM) 5 MG tablet Take 5 mg by mouth every 6 (six) hours as needed for anxiety.     . empagliflozin (JARDIANCE) 25 MG TABS tablet Take 25 mg by mouth daily.    Marland Kitchen esomeprazole (NEXIUM) 40 MG capsule Take 40 mg by mouth daily as needed (for acid reflux).     . fluticasone (FLONASE) 50 MCG/ACT nasal spray Place 1 spray into both  nostrils daily.    Marland Kitchen glimepiride (AMARYL) 1 MG tablet Take 1 mg by mouth daily.    . hydrochlorothiazide (HYDRODIURIL) 25 MG tablet Take 25 mg by mouth daily.    . Iron-FA-B Cmp-C-Biot-Probiotic (FUSION PLUS) CAPS Take 1 capsule by mouth daily.    . metoprolol (TOPROL-XL) 100 MG 24 hr tablet Take 100 mg by mouth daily.      . sitaGLIPtin-metformin (JANUMET) 50-1000 MG tablet Take 1 tablet by mouth 2 (two) times daily with a meal.    . spironolactone (ALDACTONE) 25 MG tablet Take 25 mg by mouth daily.    Marland Kitchen  telmisartan (MICARDIS) 40 MG tablet Take 20 mg by mouth daily.      No current facility-administered medications for this visit.    PAST MEDICAL HISTORY: Past Medical History:  Diagnosis Date  . Anemia   . Diabetes mellitus   . DM (diabetes mellitus) ([place]) 10/19/2018  . HLD (hyperlipidemia) 10/19/2018  . HTN (hypertension) 10/19/2018  . Hypertension   . Palpitations 10/19/2018  . Stroke [facility])     PAST SURGICAL HISTORY: Past Surgical History:  Procedure Laterality Date  . ABDOMINAL HYSTERECTOMY     1993  . CESAREAN SECTION      FAMILY HISTORY: Family History  Problem Relation Age of Onset  . Hypertension Father   . Diabetes Mother   . Hypertension Mother   . Diabetes Brother   . Diabetes Sister     SOCIAL HISTORY: Social History   Socioeconomic History  . Marital status: Married    Spouse name: Not on file  . Number of children: 0  . Years of education: college  . Highest education level: Not on file  Occupational History  . Not on file  Tobacco Use  . Smoking status: Never Smoker  . Smokeless tobacco: Never Used  Substance and Sexual Activity  . Alcohol use: No  . Drug use: No  . Sexual activity: Yes    Birth control/protection: Surgical  Other Topics Concern  . Not on file  Social History Narrative   Lives at home with her husband.   Three cups caffeine per day.   Left-handed.   Social Determinants of Health   Financial Resource Strain:   . Difficulty of Paying Living Expenses: Not on file  Food Insecurity:   . Worried About Charity fundraiser in the Last Year: Not on file  . Ran Out of Food in the Last Year: Not on file  Transportation Needs:   . Lack of Transportation (Medical): Not on file  . Lack of Transportation (Non-Medical): Not on file  Physical Activity:   . Days of Exercise per Week: Not on file  . Minutes of Exercise per Session: Not on file  Stress:   . Feeling of Stress : Not on file  Social Connections:   . Frequency of  Communication with Friends and Family: Not on file  . Frequency of Social Gatherings with Friends and Family: Not on file  . Attends Religious Services: Not on file  . Active Member of Clubs or Organizations: Not on file  . Attends Archivist Meetings: Not on file  . Marital Status: Not on file  Intimate Partner Violence:   . Fear of Current or Ex-Partner: Not on file  . Emotionally Abused: Not on file  . Physically Abused: Not on file  . Sexually Abused: Not on file     PHYSICAL EXAM   Vitals:   05/01/19 0940  BP:  116/70  Pulse: 63  Temp: (!) 95.9 F (35.5 C)  TempSrc: Oral  Weight: 138 lb 3.2 oz (62.7 kg)  Height: 5\' 1"  (1.549 m)    Not recorded      Body mass index is 26.11 kg/m.  PHYSICAL EXAMNIATION:  General: well developed, well nourished, pleasant elderly AA female, seated, in no evident distress Head: head normocephalic and atraumatic.   Neck: supple with no carotid or supraclavicular bruits Cardiovascular: regular rate and rhythm, no murmurs Musculoskeletal: no deformity Skin:  no rash/petichiae Vascular:  Normal pulses all extremities   Neurologic Exam Mental Status: Awake and fully alert. Normal speech and language. Oriented to place and time. Recent and remote memory intact. Attention span, concentration and fund of knowledge appropriate. Mood and affect appropriate.  Cranial Nerves: Pupils equal, briskly reactive to light. Extraocular movements full without nystagmus. Visual fields full to confrontation. Hearing intact. Facial sensation intact. Face, tongue, palate moves normally and symmetrically.  Motor: Normal bulk and tone. Normal strength in all tested extremity muscles. Sensory.: intact to touch , pinprick , position and vibratory sensation.  Coordination: Rapid alternating movements normal in all extremities. Finger-to-nose and heel-to-shin performed accurately bilaterally. Gait and Station: Arises from chair without difficulty. Stance  is normal. Gait demonstrates normal stride length and balance Reflexes: 1+ and symmetric. Toes downgoing.         ASSESSMENT AND PLAN  METZLI BUNTEN is a 74 y.o. female who is being seen today for left lentiform nuclei and left occipital lobe stroke felt likely secondary to small vessel disease on 01/12/2019.  Due to separate areas of infarcts, 30-day cardiac event monitor completed which was negative for atrial fibrillation.  Vascular risk factors include HTN, HLD and DM.  No residual deficits    1. Posterior stroke: Continue clopidogrel 75 mg daily for secondary stroke prevention. Maintain strict control of hypertension with blood pressure goal below 130/90, diabetes with hemoglobin A1c goal below 6.5% and cholesterol with LDL cholesterol (bad cholesterol) goal below 70 mg/dL.  I also advised the patient to eat a healthy diet with plenty of whole grains, cereals, fruits and vegetables, exercise regularly with at least 30 minutes of continuous activity daily and maintain ideal body weight. 2. HTN: Advised to continue current treatment regimen.  Today's BP stable.  Advised to continue to monitor at home along with continued follow-up with PCP for management 3. HLD: Difficulty tolerating atorvastatin and self discontinued.  Reports being on prior statins with intolerance.  Recommend consideration of Zetia 10 mg daily versus PCSK9 inhibitor and advised patient to follow-up with PCP regarding recent lab work and consideration of initiating Zetia versus PCSK9 inhibitor 4. DMII: Advised to continue to monitor glucose levels at home along with continued follow-up with PCP for management and monitoring 5. Transient left hand numbness: Resolved after movement exercises.  Suspicion for possible nerve impingement and advised to continue to monitor at this time.  If symptoms return advised to call office or follow-up with PCP for further evaluation.  If symptoms return but worsen with further involvement  of numbness/tingling, advised to call 911 for further evaluation  Follow-up in 6 months or call earlier if needed  Greater than 50% of time during this 20 minute visit was spent on counseling,explanation of diagnosis of prior stroke, reviewing risk factor management of HTN, HLD and DM, planning of further management, discussion regarding recent episode on right hand numbness and possible etiologies, and answered all questions to patient satisfaction  Frann Rider, AGNP-BC  [facility] [address] [place], [place] [address]  Phone [phone] Fax [phone] Note: This document was prepared with digital dictation and possible smart phrase technology. Any transcriptional errors that result from this process are unintentional.

## 2019-04-30 NOTE — Telephone Encounter (Signed)
I spoke to the patient and she verbalized understanding of her results. She will keep her pending appt w/ Frann Rider, NP on 05/01/19.

## 2019-04-30 NOTE — Telephone Encounter (Signed)
Left message, on both home and mobile number, requesting a call back.

## 2019-05-01 ENCOUNTER — Encounter: Payer: Self-pay | Admitting: Adult Health

## 2019-05-01 ENCOUNTER — Ambulatory Visit: Payer: Medicare Other | Admitting: Adult Health

## 2019-05-01 ENCOUNTER — Other Ambulatory Visit: Payer: Self-pay

## 2019-05-01 VITALS — BP 116/70 | HR 63 | Temp 95.9°F | Ht 61.0 in | Wt 138.2 lb

## 2019-05-01 DIAGNOSIS — E119 Type 2 diabetes mellitus without complications: Secondary | ICD-10-CM

## 2019-05-01 DIAGNOSIS — Z8673 Personal history of transient ischemic attack (TIA), and cerebral infarction without residual deficits: Secondary | ICD-10-CM | POA: Diagnosis not present

## 2019-05-01 DIAGNOSIS — E785 Hyperlipidemia, unspecified: Secondary | ICD-10-CM | POA: Diagnosis not present

## 2019-05-01 DIAGNOSIS — I10 Essential (primary) hypertension: Secondary | ICD-10-CM

## 2019-05-01 NOTE — Patient Instructions (Signed)
Continue clopidogrel 75 mg daily for secondary stroke prevention  Consider use of Zetia due to difficulty tolerating statin medications - please follow up with PCP regarding recent lab work and possibly starting [place] to follow up with PCP regarding cholesterol and blood pressure management   Continue to monitor blood pressure at home  Maintain strict control of hypertension with blood pressure goal below 130/90, diabetes with hemoglobin A1c goal below 6.5% and cholesterol with LDL cholesterol (bad cholesterol) goal below 70 mg/dL. I also advised the patient to eat a healthy diet with plenty of whole grains, cereals, fruits and vegetables, exercise regularly and maintain ideal body weight.  Followup in the future with me in 6 months or call earlier if needed       Thank you for coming to see [place] at [facility]. I hope we have been able to provide you high quality care today.  You may receive a patient satisfaction survey over the next few weeks. We would appreciate your feedback and comments so that we may continue to improve ourselves and the health of our patients.

## 2019-05-14 DIAGNOSIS — E7800 Pure hypercholesterolemia, unspecified: Secondary | ICD-10-CM | POA: Diagnosis not present

## 2019-05-14 DIAGNOSIS — G459 Transient cerebral ischemic attack, unspecified: Secondary | ICD-10-CM | POA: Diagnosis not present

## 2019-05-14 DIAGNOSIS — E119 Type 2 diabetes mellitus without complications: Secondary | ICD-10-CM | POA: Diagnosis not present

## 2019-05-16 NOTE — Progress Notes (Signed)
I have reviewed and agreed above plan. 

## 2019-06-25 ENCOUNTER — Other Ambulatory Visit: Payer: Self-pay | Admitting: Cardiology

## 2019-07-17 DIAGNOSIS — E119 Type 2 diabetes mellitus without complications: Secondary | ICD-10-CM | POA: Diagnosis not present

## 2019-07-17 DIAGNOSIS — G459 Transient cerebral ischemic attack, unspecified: Secondary | ICD-10-CM | POA: Diagnosis not present

## 2019-07-26 DIAGNOSIS — Z8673 Personal history of transient ischemic attack (TIA), and cerebral infarction without residual deficits: Secondary | ICD-10-CM | POA: Diagnosis not present

## 2019-07-26 DIAGNOSIS — I10 Essential (primary) hypertension: Secondary | ICD-10-CM | POA: Diagnosis not present

## 2019-07-26 DIAGNOSIS — E1159 Type 2 diabetes mellitus with other circulatory complications: Secondary | ICD-10-CM | POA: Diagnosis not present

## 2019-07-26 DIAGNOSIS — K219 Gastro-esophageal reflux disease without esophagitis: Secondary | ICD-10-CM | POA: Diagnosis not present

## 2019-07-26 DIAGNOSIS — D509 Iron deficiency anemia, unspecified: Secondary | ICD-10-CM | POA: Diagnosis not present

## 2019-08-31 ENCOUNTER — Other Ambulatory Visit: Payer: Self-pay | Admitting: Cardiology

## 2019-10-03 ENCOUNTER — Other Ambulatory Visit: Payer: Self-pay | Admitting: Cardiology

## 2019-10-22 DIAGNOSIS — D509 Iron deficiency anemia, unspecified: Secondary | ICD-10-CM | POA: Diagnosis not present

## 2019-10-22 DIAGNOSIS — I10 Essential (primary) hypertension: Secondary | ICD-10-CM | POA: Diagnosis not present

## 2019-10-22 DIAGNOSIS — Z8673 Personal history of transient ischemic attack (TIA), and cerebral infarction without residual deficits: Secondary | ICD-10-CM | POA: Diagnosis not present

## 2019-10-22 DIAGNOSIS — E1159 Type 2 diabetes mellitus with other circulatory complications: Secondary | ICD-10-CM | POA: Diagnosis not present

## 2019-10-31 ENCOUNTER — Encounter: Payer: Self-pay | Admitting: Adult Health

## 2019-10-31 ENCOUNTER — Ambulatory Visit: Payer: Medicare Other | Admitting: Adult Health

## 2019-10-31 VITALS — BP 131/68 | HR 70 | Ht 61.0 in | Wt 138.0 lb

## 2019-10-31 DIAGNOSIS — I10 Essential (primary) hypertension: Secondary | ICD-10-CM | POA: Diagnosis not present

## 2019-10-31 DIAGNOSIS — T466X5A Adverse effect of antihyperlipidemic and antiarteriosclerotic drugs, initial encounter: Secondary | ICD-10-CM

## 2019-10-31 DIAGNOSIS — E785 Hyperlipidemia, unspecified: Secondary | ICD-10-CM | POA: Diagnosis not present

## 2019-10-31 DIAGNOSIS — E119 Type 2 diabetes mellitus without complications: Secondary | ICD-10-CM | POA: Diagnosis not present

## 2019-10-31 DIAGNOSIS — M791 Myalgia, unspecified site: Secondary | ICD-10-CM

## 2019-10-31 DIAGNOSIS — Z8673 Personal history of transient ischemic attack (TIA), and cerebral infarction without residual deficits: Secondary | ICD-10-CM | POA: Diagnosis not present

## 2019-10-31 NOTE — Patient Instructions (Signed)
Continue clopidogrel 75 mg daily for secondary stroke prevention  Continue to follow up with PCP regarding cholesterol, blood pressure and diabetes management  Maintain strict control of hypertension with blood pressure goal below 130/90, diabetes with hemoglobin A1c goal below 7.0% and cholesterol with LDL cholesterol (bad cholesterol) goal below 70 mg/dL.         Thank you for coming to see [place] at [facility]. I hope we have been able to provide you high quality care today.  You may receive a patient satisfaction survey over the next few weeks. We would appreciate your feedback and comments so that we may continue to improve ourselves and the health of our patients.

## 2019-10-31 NOTE — Progress Notes (Signed)
PATIENT: Carrie [name] DOB: [birth year]  Chief Complaint  Patient presents with  . Follow-up    6 month f/u. States she has been doing well since last visit.   Marland Kitchen room 5    alone      HISTORICAL  Today, 10/31/2019, Carrie [name] returns for stroke follow-up.  Stable without residual deficits and denies new or reoccurring stroke/TIA symptoms  Continues on clopidogrel without bleeding or bruising Not currently on statin. She trialed Zetia 4 days but experienced muscle pains in back and cramping in arm/back. Symptoms subsided after stopping. Recent lab work last Monday and has appointment next Tuesday to discuss lab results Blood pressure today 131/68 Glucose levels stable per patient - range 100-120s Closely with PCP for HTN, HLD and DM management  No concerns at this time    History provided for reference purposes only Update 04/30/2019 Carrie [name]: Carrie [name] is a 74 year old female who is being seen today for stroke follow-up.  Cardiac event monitor negative for atrial fibrillation.  Continues on Plavix  for secondary stroke prevention without side effects. She self discontinued atorvastatin due to c/o itching 2 hrs after taking dose and felt bumps under skin.  She has not been restarted on any cholesterol medication since that time but does state recent lab work done by PCP currently awaiting results.  Unable to personally review through epic.  Blood pressure today 116/70.  She does report last night having to lower episode of numbness/tingling sensation of left pointer finger and middle finger which resolved after several exercises.  She denies numbness/tingling elsewhere or any weakness or any other neurological symptoms.  Symptoms have not returned since that time.  No further concerns at this time.  Initial evaluation 01/24/2019 Carrie [name]: Carrie [name] is a 74 year old female, seen in request by her primary care physician Dr. Jani Gravel, for treatment of stroke, initial evaluation  was on January 24, 2019.  I have reviewed and summarized the referring note from the referring physician.  She had a past medical history of hyperlipidemia, hypertension, diabetes, she was admitted to the hospital on January 12, 2019 for acute onset of dysarthria, right and lower extremity weakness January 11, 2019.    I personally reviewed CT head, MRI of the brain, acute infarction in the posterior limb of left lentiform nucleus, and the left occipital lobe, moderate supratentorium small vessel disease.  CT angiogram of head and neck showed no large vessel disease.  Patient symptoms quickly resolved within 1 hour Echocardiogram showed normal ejection fraction, no significant valvular disease  Laboratory evaluation A1c 7.4, LDL 129, she was on aspirin 81 mg daily, now she is discharged with combination of aspirin 81 mg plus Plavix 75 mg daily EEG was normal on Jan 13 2019.     REVIEW OF SYSTEMS: Full 14 system review of systems performed and notable only for as above All other review of systems were negative     ALLERGIES: Allergies  Allergen Reactions  . Lipitor [Atorvastatin] Hives    Patient reported that she broke out from in a rash   . Penicillins Hives    HOME MEDICATIONS: Current Outpatient Medications  Medication Sig Dispense Refill  . amLODipine (NORVASC) 5 MG tablet Take 5 mg by mouth daily.    . cetirizine (ZYRTEC) 10 MG tablet Take 10 mg by mouth at bedtime as needed for allergies.     . cloNIDine (CATAPRES) 0.2 MG tablet Take 1 tablet by mouth twice daily 180 tablet 0  .  clopidogrel (PLAVIX) 75 MG tablet Take 1 tablet (75 mg total) by mouth daily. 90 tablet 4  . diazepam (VALIUM) 5 MG tablet Take 5 mg by mouth every 6 (six) hours as needed for anxiety.     . empagliflozin (JARDIANCE) 25 MG TABS tablet Take 25 mg by mouth daily.    Marland Kitchen esomeprazole (NEXIUM) 40 MG capsule Take 40 mg by mouth daily as needed (for acid reflux).     . fluticasone (FLONASE) 50 MCG/ACT  nasal spray Place 1 spray into both nostrils daily.    Marland Kitchen glimepiride (AMARYL) 4 MG tablet Take 4 mg by mouth daily with breakfast.    . hydrochlorothiazide (HYDRODIURIL) 25 MG tablet Take 25 mg by mouth daily.    . Iron-FA-B Cmp-C-Biot-Probiotic (FUSION PLUS) CAPS Take 1 capsule by mouth daily.    . metoprolol (TOPROL-XL) 100 MG 24 hr tablet Take 100 mg by mouth daily.      . sitaGLIPtin-metformin (JANUMET) 50-1000 MG tablet Take 1 tablet by mouth 2 (two) times daily with a meal.    . spironolactone (ALDACTONE) 25 MG tablet TAKE 1 TABLET BY MOUTH ONCE DAILY IN THE MORNING 90 tablet 3  . telmisartan (MICARDIS) 40 MG tablet Take 20 mg by mouth daily.      No current facility-administered medications for this visit.    PAST MEDICAL HISTORY: Past Medical History:  Diagnosis Date  . Anemia   . Diabetes mellitus   . DM (diabetes mellitus) ([place]) 10/19/2018  . HLD (hyperlipidemia) 10/19/2018  . HTN (hypertension) 10/19/2018  . Hypertension   . Palpitations 10/19/2018  . Stroke [facility])     PAST SURGICAL HISTORY: Past Surgical History:  Procedure Laterality Date  . ABDOMINAL HYSTERECTOMY     1993  . CESAREAN SECTION      FAMILY HISTORY: Family History  Problem Relation Age of Onset  . Hypertension Father   . Diabetes Mother   . Hypertension Mother   . Diabetes Brother   . Diabetes Sister     SOCIAL HISTORY: Social History   Socioeconomic History  . Marital status: Married    Spouse name: Not on file  . Number of children: 0  . Years of education: college  . Highest education level: Not on file  Occupational History  . Not on file  Tobacco Use  . Smoking status: Never Smoker  . Smokeless tobacco: Never Used  Vaping Use  . Vaping Use: Never used  Substance and Sexual Activity  . Alcohol use: No  . Drug use: No  . Sexual activity: Yes    Birth control/protection: Surgical  Other Topics Concern  . Not on file  Social History Narrative   Lives at home with her husband.    Three cups caffeine per day.   Left-handed.   Social Determinants of Health   Financial Resource Strain:   . Difficulty of Paying Living Expenses:   Food Insecurity:   . Worried About Charity fundraiser in the Last Year:   . Arboriculturist in the Last Year:   Transportation Needs:   . Film/video editor (Medical):   Marland Kitchen Lack of Transportation (Non-Medical):   Physical Activity:   . Days of Exercise per Week:   . Minutes of Exercise per Session:   Stress:   . Feeling of Stress :   Social Connections:   . Frequency of Communication with Friends and Family:   . Frequency of Social Gatherings with Friends and Family:   .  Attends Religious Services:   . Active Member of Clubs or Organizations:   . Attends Archivist Meetings:   Marland Kitchen Marital Status:   Intimate Partner Violence:   . Fear of Current or Ex-Partner:   . Emotionally Abused:   Marland Kitchen Physically Abused:   . Sexually Abused:      PHYSICAL EXAM   Vitals:   10/31/19 0947  BP: 131/68  Pulse: 70  Weight: 138 lb (62.6 kg)  Height: 5\' 1"  (1.549 m)   Not recorded     Body mass index is 26.07 kg/m.  PHYSICAL EXAMNIATION:  General: well developed, well nourished, pleasant elderly AA female, seated, in no evident distress Head: head normocephalic and atraumatic.   Neck: supple with no carotid or supraclavicular bruits Cardiovascular: regular rate and rhythm, no murmurs Musculoskeletal: no deformity Skin:  no rash/petichiae Vascular:  Normal pulses all extremities   Neurologic Exam Mental Status: Awake and fully alert.   Fluent speech and language. Oriented to place and time. Recent and remote memory intact. Attention span, concentration and fund of knowledge appropriate. Mood and affect appropriate.  Cranial Nerves: Pupils equal, briskly reactive to light. Extraocular movements full without nystagmus. Visual fields full to confrontation. Hearing intact. Facial sensation intact. Face, tongue, palate moves  normally and symmetrically.  Motor: Normal bulk and tone. Normal strength in all tested extremity muscles. Sensory.: intact to touch , pinprick , position and vibratory sensation.  Coordination: Rapid alternating movements normal in all extremities. Finger-to-nose and heel-to-shin performed accurately bilaterally. Gait and Station: Arises from chair without difficulty. Stance is normal. Gait demonstrates normal stride length and balance Reflexes: 1+ and symmetric. Toes downgoing.         ASSESSMENT AND PLAN  GURTHA PICKER is a 74 y.o. female who is being seen today for left lentiform nuclei and left occipital lobe stroke felt likely secondary to small vessel disease on 01/12/2019.  Due to separate areas of infarcts, 30-day cardiac event monitor completed which was negative for atrial fibrillation.  Vascular risk factors include HTN, HLD and DM.      1. Posterior stroke:  a. Recovered without residual deficits.   b. Continue clopidogrel 75 mg daily for secondary stroke prevention.   c. Discussed importance of close PCP follow-up for aggressive stroke risk factor management 2. HTN:  a. BP goal<130/90.   b. Stable.   c. Managed by PCP 3. HLD:  a. LDL goal <70.   b. History of statin intolerance with atorvastatin, simvastatin and recently Zetia  c. Discussed use of PCSK9 inhibitor which can be further reviewed with PCP as lab work recently obtained but unable to view via epic.  May need referral to lipid clinic for further management which can also be provided by PCP 4. DMII:  a. A1c goal <7.0.  b. Stable per patient c. Managed by PCP.     Overall stable from stroke standpoint recommend follow up on an as-needed basis   I spent 20 minutes of face-to-face and non-face-to-face time with patient.  This included previsit chart review, lab review, study review, order entry, electronic health record documentation, patient education regarding history of stroke, importance of managing  stroke risk factors, statin intolerance and further options, and answered all other questions to patient satisfaction    Frann Rider, [facility]  [facility] [address] [place], [place] [address]  Phone [phone] Fax [phone] Note: This document was prepared with digital dictation and possible smart phrase technology. Any transcriptional errors  that result from this process are unintentional.

## 2019-11-02 NOTE — Progress Notes (Signed)
I agree with the above plan 

## 2019-11-06 ENCOUNTER — Telehealth: Payer: Self-pay | Admitting: Adult Health

## 2019-11-06 NOTE — Telephone Encounter (Signed)
Received fax from PCP regarding recent work obtained  Lipid panel Cholesterol, total 213 Triglycerides 107 HDL cholesterol 50 LDL chol calc 139 LDL/HDL ratio 2.5  A1c 7.2   History of statin intolerance with multiple statins tried as well as recently Zetia.  Due to continued elevated LDL or bad cholesterol at 139 with goal of less than 70, would recommend initiating PCSK9 inhibitor such as Repatha.  This was discussed at prior visit but she wished to follow-up with her PCP for further discussion.  Request PCP initiate Repatha or Praluent if he agrees.

## 2020-01-08 ENCOUNTER — Other Ambulatory Visit: Payer: Self-pay | Admitting: Cardiology

## 2020-01-08 NOTE — Telephone Encounter (Signed)
Refill request

## 2020-01-11 DIAGNOSIS — Z23 Encounter for immunization: Secondary | ICD-10-CM | POA: Diagnosis not present

## 2020-03-11 DIAGNOSIS — I10 Essential (primary) hypertension: Secondary | ICD-10-CM | POA: Diagnosis not present

## 2020-03-11 DIAGNOSIS — Z1231 Encounter for screening mammogram for malignant neoplasm of breast: Secondary | ICD-10-CM | POA: Diagnosis not present

## 2020-03-11 DIAGNOSIS — L0390 Cellulitis, unspecified: Secondary | ICD-10-CM | POA: Diagnosis not present

## 2020-03-25 DIAGNOSIS — R946 Abnormal results of thyroid function studies: Secondary | ICD-10-CM | POA: Diagnosis not present

## 2020-03-25 DIAGNOSIS — E1159 Type 2 diabetes mellitus with other circulatory complications: Secondary | ICD-10-CM | POA: Diagnosis not present

## 2020-03-25 DIAGNOSIS — I10 Essential (primary) hypertension: Secondary | ICD-10-CM | POA: Diagnosis not present

## 2020-03-25 DIAGNOSIS — E789 Disorder of lipoprotein metabolism, unspecified: Secondary | ICD-10-CM | POA: Diagnosis not present

## 2020-04-07 DIAGNOSIS — S81801A Unspecified open wound, right lower leg, initial encounter: Secondary | ICD-10-CM | POA: Diagnosis not present

## 2020-04-07 DIAGNOSIS — E789 Disorder of lipoprotein metabolism, unspecified: Secondary | ICD-10-CM | POA: Diagnosis not present

## 2020-04-07 DIAGNOSIS — Z0000 Encounter for general adult medical examination without abnormal findings: Secondary | ICD-10-CM | POA: Diagnosis not present

## 2020-04-07 DIAGNOSIS — D509 Iron deficiency anemia, unspecified: Secondary | ICD-10-CM | POA: Diagnosis not present

## 2020-04-07 DIAGNOSIS — K219 Gastro-esophageal reflux disease without esophagitis: Secondary | ICD-10-CM | POA: Diagnosis not present

## 2020-04-07 DIAGNOSIS — Z79899 Other long term (current) drug therapy: Secondary | ICD-10-CM | POA: Diagnosis not present

## 2020-04-07 DIAGNOSIS — Z8673 Personal history of transient ischemic attack (TIA), and cerebral infarction without residual deficits: Secondary | ICD-10-CM | POA: Diagnosis not present

## 2020-04-07 DIAGNOSIS — E1159 Type 2 diabetes mellitus with other circulatory complications: Secondary | ICD-10-CM | POA: Diagnosis not present

## 2020-04-07 DIAGNOSIS — I10 Essential (primary) hypertension: Secondary | ICD-10-CM | POA: Diagnosis not present

## 2020-04-23 ENCOUNTER — Other Ambulatory Visit: Payer: Self-pay | Admitting: Neurology

## 2020-04-23 ENCOUNTER — Other Ambulatory Visit: Payer: Self-pay | Admitting: Cardiology

## 2020-04-24 ENCOUNTER — Other Ambulatory Visit: Payer: Self-pay

## 2020-04-24 ENCOUNTER — Other Ambulatory Visit: Payer: Self-pay | Admitting: Neurology

## 2020-04-24 ENCOUNTER — Telehealth: Payer: Self-pay

## 2020-04-24 MED ORDER — CLONIDINE HCL 0.2 MG PO TABS: 0.2000 mg | ORAL_TABLET | Freq: Two times a day (BID) | ORAL | 0 refills | Status: AC

## 2020-04-24 NOTE — Telephone Encounter (Signed)
Pt called in requesting refill on clonidine. Advised she was made PRN at 10/2018 ov and to contact pcp office for refills. Called pharmacy to cancel rx sent in before realizing pt was prn.//ah

## 2020-05-05 DIAGNOSIS — E1136 Type 2 diabetes mellitus with diabetic cataract: Secondary | ICD-10-CM | POA: Diagnosis not present

## 2020-05-05 DIAGNOSIS — H25813 Combined forms of age-related cataract, bilateral: Secondary | ICD-10-CM | POA: Diagnosis not present

## 2020-05-30 ENCOUNTER — Ambulatory Visit: Payer: Medicare Other | Admitting: Cardiology

## 2020-06-03 DIAGNOSIS — T8133XA Disruption of traumatic injury wound repair, initial encounter: Secondary | ICD-10-CM | POA: Diagnosis not present

## 2020-06-03 DIAGNOSIS — J309 Allergic rhinitis, unspecified: Secondary | ICD-10-CM | POA: Diagnosis not present

## 2020-06-03 DIAGNOSIS — I10 Essential (primary) hypertension: Secondary | ICD-10-CM | POA: Diagnosis not present

## 2020-06-03 DIAGNOSIS — E1159 Type 2 diabetes mellitus with other circulatory complications: Secondary | ICD-10-CM | POA: Diagnosis not present

## 2020-06-03 DIAGNOSIS — K219 Gastro-esophageal reflux disease without esophagitis: Secondary | ICD-10-CM | POA: Diagnosis not present

## 2020-06-03 DIAGNOSIS — Z8673 Personal history of transient ischemic attack (TIA), and cerebral infarction without residual deficits: Secondary | ICD-10-CM | POA: Diagnosis not present

## 2020-06-03 DIAGNOSIS — E789 Disorder of lipoprotein metabolism, unspecified: Secondary | ICD-10-CM | POA: Diagnosis not present

## 2020-06-03 DIAGNOSIS — D509 Iron deficiency anemia, unspecified: Secondary | ICD-10-CM | POA: Diagnosis not present

## 2020-06-03 DIAGNOSIS — Z79899 Other long term (current) drug therapy: Secondary | ICD-10-CM | POA: Diagnosis not present

## 2020-06-18 DIAGNOSIS — R609 Edema, unspecified: Secondary | ICD-10-CM | POA: Diagnosis not present

## 2020-06-18 DIAGNOSIS — I10 Essential (primary) hypertension: Secondary | ICD-10-CM | POA: Diagnosis not present

## 2020-06-18 DIAGNOSIS — E1159 Type 2 diabetes mellitus with other circulatory complications: Secondary | ICD-10-CM | POA: Diagnosis not present

## 2020-06-18 DIAGNOSIS — D509 Iron deficiency anemia, unspecified: Secondary | ICD-10-CM | POA: Diagnosis not present

## 2020-06-18 DIAGNOSIS — L97919 Non-pressure chronic ulcer of unspecified part of right lower leg with unspecified severity: Secondary | ICD-10-CM | POA: Diagnosis not present

## 2020-06-18 DIAGNOSIS — K219 Gastro-esophageal reflux disease without esophagitis: Secondary | ICD-10-CM | POA: Diagnosis not present

## 2020-06-18 DIAGNOSIS — Z79899 Other long term (current) drug therapy: Secondary | ICD-10-CM | POA: Diagnosis not present

## 2020-06-18 DIAGNOSIS — E789 Disorder of lipoprotein metabolism, unspecified: Secondary | ICD-10-CM | POA: Diagnosis not present

## 2020-06-18 DIAGNOSIS — T8133XA Disruption of traumatic injury wound repair, initial encounter: Secondary | ICD-10-CM | POA: Diagnosis not present

## 2020-06-18 DIAGNOSIS — Z8673 Personal history of transient ischemic attack (TIA), and cerebral infarction without residual deficits: Secondary | ICD-10-CM | POA: Diagnosis not present

## 2020-06-18 DIAGNOSIS — J309 Allergic rhinitis, unspecified: Secondary | ICD-10-CM | POA: Diagnosis not present

## 2020-06-18 DIAGNOSIS — I83019 Varicose veins of right lower extremity with ulcer of unspecified site: Secondary | ICD-10-CM | POA: Diagnosis not present

## 2020-06-18 DIAGNOSIS — T8133XD Disruption of traumatic injury wound repair, subsequent encounter: Secondary | ICD-10-CM | POA: Diagnosis not present

## 2020-06-25 DIAGNOSIS — R609 Edema, unspecified: Secondary | ICD-10-CM | POA: Diagnosis not present

## 2020-06-25 DIAGNOSIS — L97919 Non-pressure chronic ulcer of unspecified part of right lower leg with unspecified severity: Secondary | ICD-10-CM | POA: Diagnosis not present

## 2020-06-25 DIAGNOSIS — T8133XA Disruption of traumatic injury wound repair, initial encounter: Secondary | ICD-10-CM | POA: Diagnosis not present

## 2020-06-25 DIAGNOSIS — I10 Essential (primary) hypertension: Secondary | ICD-10-CM | POA: Diagnosis not present

## 2020-06-25 DIAGNOSIS — Z79899 Other long term (current) drug therapy: Secondary | ICD-10-CM | POA: Diagnosis not present

## 2020-06-25 DIAGNOSIS — T8133XD Disruption of traumatic injury wound repair, subsequent encounter: Secondary | ICD-10-CM | POA: Diagnosis not present

## 2020-06-25 DIAGNOSIS — D509 Iron deficiency anemia, unspecified: Secondary | ICD-10-CM | POA: Diagnosis not present

## 2020-06-25 DIAGNOSIS — J309 Allergic rhinitis, unspecified: Secondary | ICD-10-CM | POA: Diagnosis not present

## 2020-06-25 DIAGNOSIS — Z8673 Personal history of transient ischemic attack (TIA), and cerebral infarction without residual deficits: Secondary | ICD-10-CM | POA: Diagnosis not present

## 2020-06-25 DIAGNOSIS — E1159 Type 2 diabetes mellitus with other circulatory complications: Secondary | ICD-10-CM | POA: Diagnosis not present

## 2020-06-25 DIAGNOSIS — K219 Gastro-esophageal reflux disease without esophagitis: Secondary | ICD-10-CM | POA: Diagnosis not present

## 2020-06-25 DIAGNOSIS — E789 Disorder of lipoprotein metabolism, unspecified: Secondary | ICD-10-CM | POA: Diagnosis not present

## 2020-06-25 DIAGNOSIS — I83019 Varicose veins of right lower extremity with ulcer of unspecified site: Secondary | ICD-10-CM | POA: Diagnosis not present

## 2020-06-30 DIAGNOSIS — E789 Disorder of lipoprotein metabolism, unspecified: Secondary | ICD-10-CM | POA: Diagnosis not present

## 2020-06-30 DIAGNOSIS — I10 Essential (primary) hypertension: Secondary | ICD-10-CM | POA: Diagnosis not present

## 2020-06-30 DIAGNOSIS — E1159 Type 2 diabetes mellitus with other circulatory complications: Secondary | ICD-10-CM | POA: Diagnosis not present

## 2020-07-07 DIAGNOSIS — J309 Allergic rhinitis, unspecified: Secondary | ICD-10-CM | POA: Diagnosis not present

## 2020-07-07 DIAGNOSIS — I10 Essential (primary) hypertension: Secondary | ICD-10-CM | POA: Diagnosis not present

## 2020-07-07 DIAGNOSIS — Z8673 Personal history of transient ischemic attack (TIA), and cerebral infarction without residual deficits: Secondary | ICD-10-CM | POA: Diagnosis not present

## 2020-07-07 DIAGNOSIS — Z79899 Other long term (current) drug therapy: Secondary | ICD-10-CM | POA: Diagnosis not present

## 2020-07-07 DIAGNOSIS — E7800 Pure hypercholesterolemia, unspecified: Secondary | ICD-10-CM | POA: Diagnosis not present

## 2020-07-07 DIAGNOSIS — E1159 Type 2 diabetes mellitus with other circulatory complications: Secondary | ICD-10-CM | POA: Diagnosis not present

## 2020-07-07 DIAGNOSIS — D509 Iron deficiency anemia, unspecified: Secondary | ICD-10-CM | POA: Diagnosis not present

## 2020-07-09 DIAGNOSIS — E1159 Type 2 diabetes mellitus with other circulatory complications: Secondary | ICD-10-CM | POA: Diagnosis not present

## 2020-07-09 DIAGNOSIS — I83019 Varicose veins of right lower extremity with ulcer of unspecified site: Secondary | ICD-10-CM | POA: Diagnosis not present

## 2020-07-09 DIAGNOSIS — L97919 Non-pressure chronic ulcer of unspecified part of right lower leg with unspecified severity: Secondary | ICD-10-CM | POA: Diagnosis not present

## 2020-07-09 DIAGNOSIS — T8133XD Disruption of traumatic injury wound repair, subsequent encounter: Secondary | ICD-10-CM | POA: Diagnosis not present

## 2020-07-16 DIAGNOSIS — E1159 Type 2 diabetes mellitus with other circulatory complications: Secondary | ICD-10-CM | POA: Diagnosis not present

## 2020-07-16 DIAGNOSIS — I83019 Varicose veins of right lower extremity with ulcer of unspecified site: Secondary | ICD-10-CM | POA: Diagnosis not present

## 2020-07-16 DIAGNOSIS — I10 Essential (primary) hypertension: Secondary | ICD-10-CM | POA: Diagnosis not present

## 2020-07-16 DIAGNOSIS — R609 Edema, unspecified: Secondary | ICD-10-CM | POA: Diagnosis not present

## 2020-07-16 DIAGNOSIS — L97919 Non-pressure chronic ulcer of unspecified part of right lower leg with unspecified severity: Secondary | ICD-10-CM | POA: Diagnosis not present

## 2020-07-16 DIAGNOSIS — T8133XD Disruption of traumatic injury wound repair, subsequent encounter: Secondary | ICD-10-CM | POA: Diagnosis not present

## 2020-10-07 DIAGNOSIS — E1159 Type 2 diabetes mellitus with other circulatory complications: Secondary | ICD-10-CM | POA: Diagnosis not present

## 2020-10-07 DIAGNOSIS — Z79899 Other long term (current) drug therapy: Secondary | ICD-10-CM | POA: Diagnosis not present

## 2020-10-07 DIAGNOSIS — E7800 Pure hypercholesterolemia, unspecified: Secondary | ICD-10-CM | POA: Diagnosis not present

## 2020-10-14 DIAGNOSIS — I10 Essential (primary) hypertension: Secondary | ICD-10-CM | POA: Diagnosis not present

## 2020-10-14 DIAGNOSIS — E1159 Type 2 diabetes mellitus with other circulatory complications: Secondary | ICD-10-CM | POA: Diagnosis not present

## 2020-10-14 DIAGNOSIS — E7800 Pure hypercholesterolemia, unspecified: Secondary | ICD-10-CM | POA: Diagnosis not present

## 2020-10-14 DIAGNOSIS — Z79899 Other long term (current) drug therapy: Secondary | ICD-10-CM | POA: Diagnosis not present

## 2020-10-14 DIAGNOSIS — D509 Iron deficiency anemia, unspecified: Secondary | ICD-10-CM | POA: Diagnosis not present

## 2020-10-14 DIAGNOSIS — Z8673 Personal history of transient ischemic attack (TIA), and cerebral infarction without residual deficits: Secondary | ICD-10-CM | POA: Diagnosis not present

## 2021-01-12 DIAGNOSIS — E1159 Type 2 diabetes mellitus with other circulatory complications: Secondary | ICD-10-CM | POA: Diagnosis not present

## 2021-01-12 DIAGNOSIS — E7800 Pure hypercholesterolemia, unspecified: Secondary | ICD-10-CM | POA: Diagnosis not present

## 2021-01-12 DIAGNOSIS — I10 Essential (primary) hypertension: Secondary | ICD-10-CM | POA: Diagnosis not present

## 2021-01-14 DIAGNOSIS — E1159 Type 2 diabetes mellitus with other circulatory complications: Secondary | ICD-10-CM | POA: Diagnosis not present

## 2021-01-14 DIAGNOSIS — Z8673 Personal history of transient ischemic attack (TIA), and cerebral infarction without residual deficits: Secondary | ICD-10-CM | POA: Diagnosis not present

## 2021-01-14 DIAGNOSIS — E7800 Pure hypercholesterolemia, unspecified: Secondary | ICD-10-CM | POA: Diagnosis not present

## 2021-01-21 DIAGNOSIS — Z8673 Personal history of transient ischemic attack (TIA), and cerebral infarction without residual deficits: Secondary | ICD-10-CM | POA: Diagnosis not present

## 2021-01-21 DIAGNOSIS — D509 Iron deficiency anemia, unspecified: Secondary | ICD-10-CM | POA: Diagnosis not present

## 2021-01-21 DIAGNOSIS — I10 Essential (primary) hypertension: Secondary | ICD-10-CM | POA: Diagnosis not present

## 2021-01-21 DIAGNOSIS — R946 Abnormal results of thyroid function studies: Secondary | ICD-10-CM | POA: Diagnosis not present

## 2021-01-21 DIAGNOSIS — E7800 Pure hypercholesterolemia, unspecified: Secondary | ICD-10-CM | POA: Diagnosis not present

## 2021-01-21 DIAGNOSIS — Z789 Other specified health status: Secondary | ICD-10-CM | POA: Diagnosis not present

## 2021-01-21 DIAGNOSIS — E1159 Type 2 diabetes mellitus with other circulatory complications: Secondary | ICD-10-CM | POA: Diagnosis not present

## 2021-03-18 DIAGNOSIS — Z1231 Encounter for screening mammogram for malignant neoplasm of breast: Secondary | ICD-10-CM | POA: Diagnosis not present

## 2021-04-22 DIAGNOSIS — D509 Iron deficiency anemia, unspecified: Secondary | ICD-10-CM | POA: Diagnosis not present

## 2021-04-22 DIAGNOSIS — E1159 Type 2 diabetes mellitus with other circulatory complications: Secondary | ICD-10-CM | POA: Diagnosis not present

## 2021-04-22 DIAGNOSIS — E7800 Pure hypercholesterolemia, unspecified: Secondary | ICD-10-CM | POA: Diagnosis not present

## 2021-04-22 DIAGNOSIS — R946 Abnormal results of thyroid function studies: Secondary | ICD-10-CM | POA: Diagnosis not present

## 2021-04-22 DIAGNOSIS — I10 Essential (primary) hypertension: Secondary | ICD-10-CM | POA: Diagnosis not present

## 2021-04-22 DIAGNOSIS — Z8673 Personal history of transient ischemic attack (TIA), and cerebral infarction without residual deficits: Secondary | ICD-10-CM | POA: Diagnosis not present

## 2021-04-29 DIAGNOSIS — E1159 Type 2 diabetes mellitus with other circulatory complications: Secondary | ICD-10-CM | POA: Diagnosis not present

## 2021-04-29 DIAGNOSIS — D509 Iron deficiency anemia, unspecified: Secondary | ICD-10-CM | POA: Diagnosis not present

## 2021-04-29 DIAGNOSIS — I10 Essential (primary) hypertension: Secondary | ICD-10-CM | POA: Diagnosis not present

## 2021-04-29 DIAGNOSIS — Z23 Encounter for immunization: Secondary | ICD-10-CM | POA: Diagnosis not present

## 2021-04-29 DIAGNOSIS — Z8673 Personal history of transient ischemic attack (TIA), and cerebral infarction without residual deficits: Secondary | ICD-10-CM | POA: Diagnosis not present

## 2021-04-29 DIAGNOSIS — K219 Gastro-esophageal reflux disease without esophagitis: Secondary | ICD-10-CM | POA: Diagnosis not present

## 2021-04-29 DIAGNOSIS — E7800 Pure hypercholesterolemia, unspecified: Secondary | ICD-10-CM | POA: Diagnosis not present

## 2021-04-29 DIAGNOSIS — Z789 Other specified health status: Secondary | ICD-10-CM | POA: Diagnosis not present

## 2021-05-07 DIAGNOSIS — H524 Presbyopia: Secondary | ICD-10-CM | POA: Diagnosis not present

## 2021-05-07 DIAGNOSIS — H52203 Unspecified astigmatism, bilateral: Secondary | ICD-10-CM | POA: Diagnosis not present

## 2021-05-07 DIAGNOSIS — E1136 Type 2 diabetes mellitus with diabetic cataract: Secondary | ICD-10-CM | POA: Diagnosis not present

## 2021-05-07 DIAGNOSIS — H25813 Combined forms of age-related cataract, bilateral: Secondary | ICD-10-CM | POA: Diagnosis not present

## 2021-05-27 IMAGING — CT CT ANGIO HEAD
2 of 7 series · 8 of 33 positions shown · IV contrast (APPLIED)
Comparison: None.

CLINICAL DATA: Stroke follow-up

EXAM:
CT ANGIOGRAPHY HEAD AND NECK
TECHNIQUE: Multidetector CT imaging of the head and neck was performed using
the standard protocol during bolus administration of intravenous
contrast. Multiplanar CT image reconstructions and MIPs were
obtained to evaluate the vascular anatomy. Carotid stenosis
measurements (when applicable) are obtained utilizing NASCET
criteria, using the distal internal carotid diameter as the
denominator.
CONTRAST:  100mL OMNIPAQUE IOHEXOL 350 MG/ML SOLN

[Series 5: cta neck/head · axial · 0.45mm/px · z∈[-196,-80]mm · 2 of 175 slices shown]
[im 59/175  soft-tissue]
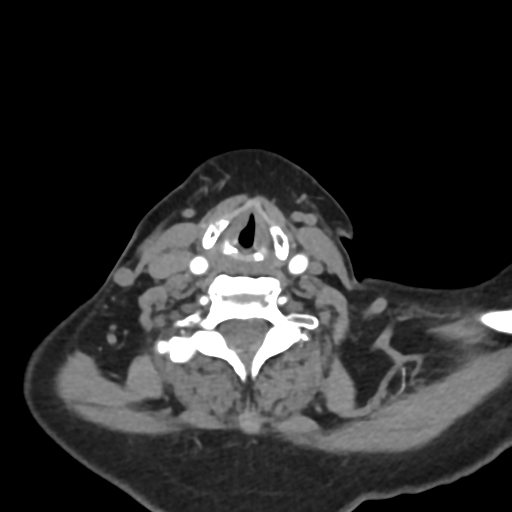
[im 117/175  soft-tissue]
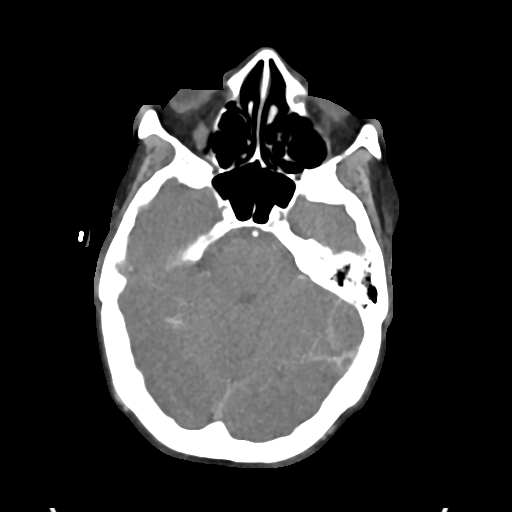

[Series 7: ax thins · axial · 0.39mm/px · z∈[-263,-13]mm · 6 of 350 slices shown]
[im 50/350  soft-tissue]
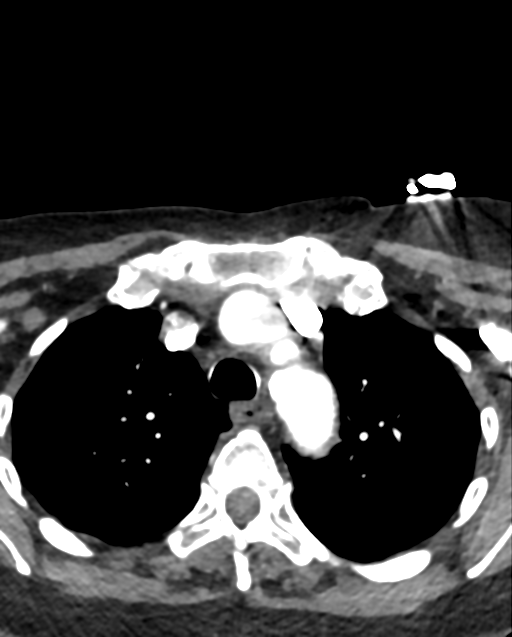
[im 100/350  bone]
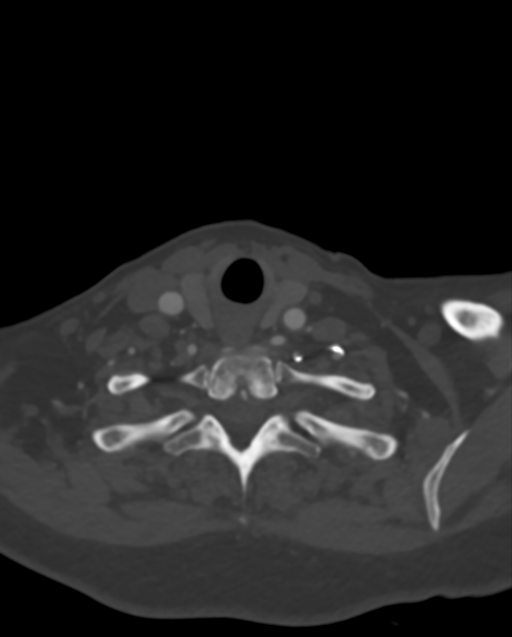
[im 150/350  soft-tissue]
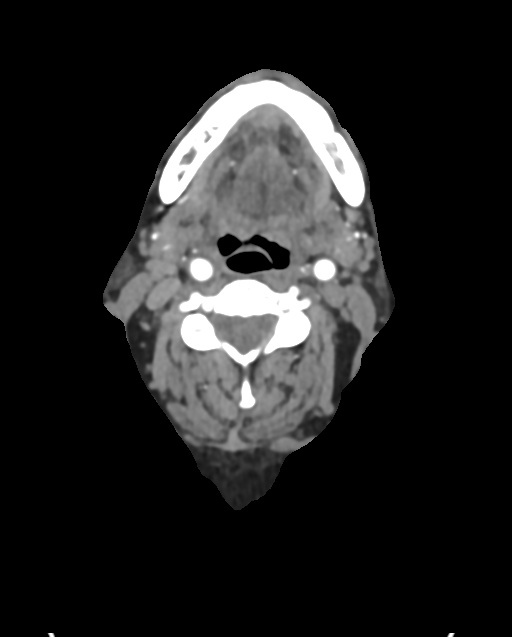
[im 200/350  bone]
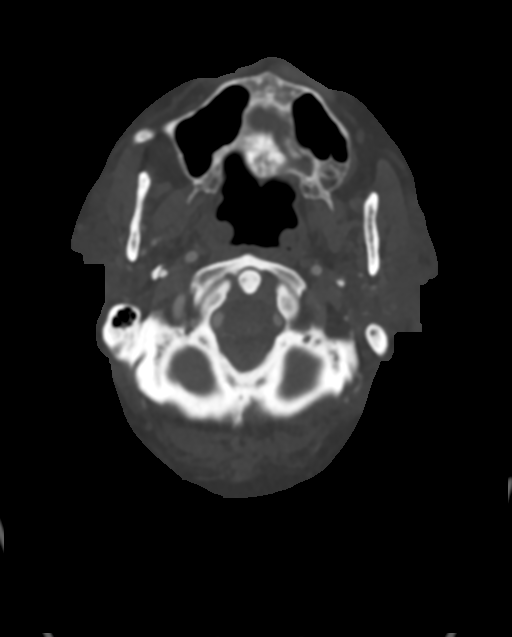
[im 250/350  soft-tissue]
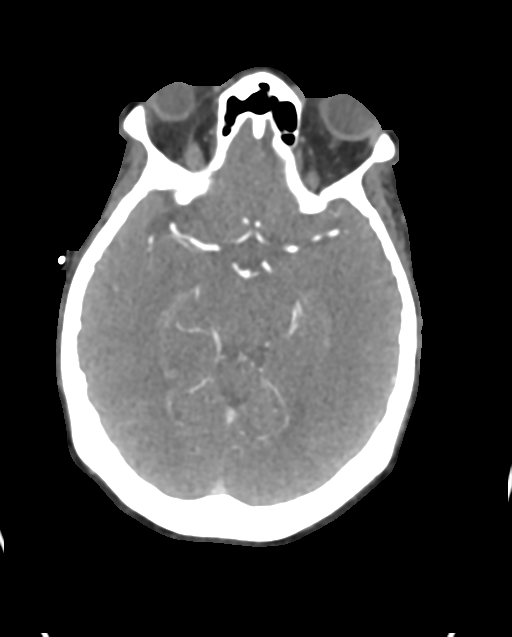
[im 300/350  bone]
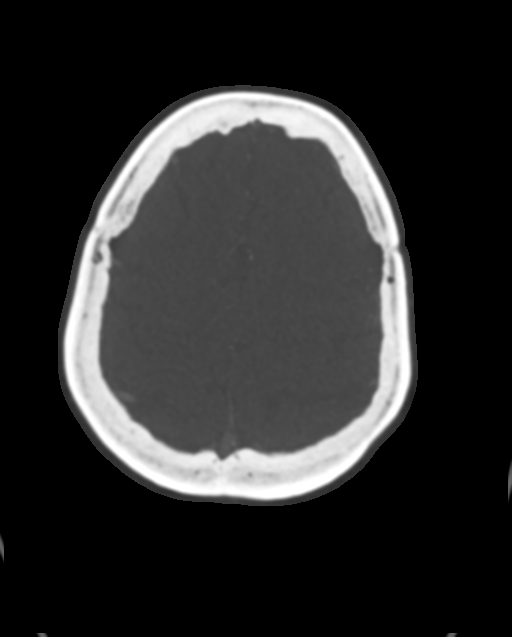

[8 of 33 positions shown; findings below may reference images not displayed]

FINDINGS: CTA NECK FINDINGS

SKELETON: There is no bony spinal canal stenosis. No lytic or
blastic lesion.

OTHER NECK: Normal pharynx, larynx and major salivary glands. No
cervical lymphadenopathy. Unremarkable thyroid gland.

UPPER CHEST: No pneumothorax or pleural effusion. No nodules or
masses.

AORTIC ARCH:

There is mild calcific atherosclerosis of the aortic arch. There is
no aneurysm, dissection or hemodynamically significant stenosis of
the visualized portion of the aorta. Conventional 3 vessel aortic
branching pattern. The visualized proximal subclavian arteries are
widely patent.

RIGHT CAROTID SYSTEM: Normal without aneurysm, dissection or
stenosis.

LEFT CAROTID SYSTEM: Normal without aneurysm, dissection or
stenosis.

VERTEBRAL ARTERIES: Codominant configuration. Both origins are
clearly patent. There is no dissection, occlusion or flow-limiting
stenosis to the skull base (V1-V3 segments).

CTA HEAD FINDINGS

POSTERIOR CIRCULATION:

--Vertebral arteries: Normal V4 segments.

--Posterior inferior cerebellar arteries (PICA): Patent origins from
the vertebral arteries.

--Anterior inferior cerebellar arteries (AICA): Patent origins from
the basilar artery.

--Basilar artery: Normal.

--Superior cerebellar arteries: Normal.

--Posterior cerebral arteries: Normal. Both originate from the
basilar artery. Posterior communicating arteries (p-comm) are
diminutive or absent.

ANTERIOR CIRCULATION:

--Intracranial internal carotid arteries: Atherosclerotic
calcification of the internal carotid arteries at the skull base
without hemodynamically significant stenosis.

--Anterior cerebral arteries (ACA): Normal. Both A1 segments are
present. Patent anterior communicating artery (a-comm).

--Middle cerebral arteries (MCA): Normal.

VENOUS SINUSES: As permitted by contrast timing, patent.

ANATOMIC VARIANTS: None

Review of the MIP images confirms the above findings.
IMPRESSION: 1. No emergent large vessel occlusion or high-grade stenosis of the
intracranial arteries.
2. Aortic Atherosclerosis (HCMDN-KGN.N).

## 2021-08-19 DIAGNOSIS — E7800 Pure hypercholesterolemia, unspecified: Secondary | ICD-10-CM | POA: Diagnosis not present

## 2021-08-19 DIAGNOSIS — Z23 Encounter for immunization: Secondary | ICD-10-CM | POA: Diagnosis not present

## 2021-08-19 DIAGNOSIS — Z8673 Personal history of transient ischemic attack (TIA), and cerebral infarction without residual deficits: Secondary | ICD-10-CM | POA: Diagnosis not present

## 2021-08-19 DIAGNOSIS — E1159 Type 2 diabetes mellitus with other circulatory complications: Secondary | ICD-10-CM | POA: Diagnosis not present

## 2021-08-19 DIAGNOSIS — Z789 Other specified health status: Secondary | ICD-10-CM | POA: Diagnosis not present

## 2021-08-26 DIAGNOSIS — D509 Iron deficiency anemia, unspecified: Secondary | ICD-10-CM | POA: Diagnosis not present

## 2021-08-26 DIAGNOSIS — K219 Gastro-esophageal reflux disease without esophagitis: Secondary | ICD-10-CM | POA: Diagnosis not present

## 2021-08-26 DIAGNOSIS — E1159 Type 2 diabetes mellitus with other circulatory complications: Secondary | ICD-10-CM | POA: Diagnosis not present

## 2021-08-26 DIAGNOSIS — Z8673 Personal history of transient ischemic attack (TIA), and cerebral infarction without residual deficits: Secondary | ICD-10-CM | POA: Diagnosis not present

## 2021-08-26 DIAGNOSIS — Z789 Other specified health status: Secondary | ICD-10-CM | POA: Diagnosis not present

## 2021-08-26 DIAGNOSIS — I10 Essential (primary) hypertension: Secondary | ICD-10-CM | POA: Diagnosis not present

## 2021-08-26 DIAGNOSIS — E7800 Pure hypercholesterolemia, unspecified: Secondary | ICD-10-CM | POA: Diagnosis not present

## 2021-12-15 DIAGNOSIS — Z789 Other specified health status: Secondary | ICD-10-CM | POA: Diagnosis not present

## 2021-12-15 DIAGNOSIS — E7800 Pure hypercholesterolemia, unspecified: Secondary | ICD-10-CM | POA: Diagnosis not present

## 2021-12-15 DIAGNOSIS — I10 Essential (primary) hypertension: Secondary | ICD-10-CM | POA: Diagnosis not present

## 2021-12-15 DIAGNOSIS — D509 Iron deficiency anemia, unspecified: Secondary | ICD-10-CM | POA: Diagnosis not present

## 2021-12-15 DIAGNOSIS — E1159 Type 2 diabetes mellitus with other circulatory complications: Secondary | ICD-10-CM | POA: Diagnosis not present

## 2021-12-22 DIAGNOSIS — E7800 Pure hypercholesterolemia, unspecified: Secondary | ICD-10-CM | POA: Diagnosis not present

## 2021-12-22 DIAGNOSIS — Z8673 Personal history of transient ischemic attack (TIA), and cerebral infarction without residual deficits: Secondary | ICD-10-CM | POA: Diagnosis not present

## 2021-12-22 DIAGNOSIS — I10 Essential (primary) hypertension: Secondary | ICD-10-CM | POA: Diagnosis not present

## 2021-12-22 DIAGNOSIS — E789 Disorder of lipoprotein metabolism, unspecified: Secondary | ICD-10-CM | POA: Diagnosis not present

## 2021-12-22 DIAGNOSIS — R0989 Other specified symptoms and signs involving the circulatory and respiratory systems: Secondary | ICD-10-CM | POA: Diagnosis not present

## 2021-12-22 DIAGNOSIS — E1159 Type 2 diabetes mellitus with other circulatory complications: Secondary | ICD-10-CM | POA: Diagnosis not present

## 2021-12-24 ENCOUNTER — Telehealth: Payer: Self-pay | Admitting: *Deleted

## 2021-12-25 ENCOUNTER — Ambulatory Visit: Payer: Medicare Other | Admitting: Podiatry

## 2021-12-25 DIAGNOSIS — L309 Dermatitis, unspecified: Secondary | ICD-10-CM

## 2021-12-25 DIAGNOSIS — M79674 Pain in right toe(s): Secondary | ICD-10-CM | POA: Diagnosis not present

## 2021-12-25 DIAGNOSIS — M79675 Pain in left toe(s): Secondary | ICD-10-CM

## 2021-12-25 DIAGNOSIS — B351 Tinea unguium: Secondary | ICD-10-CM

## 2021-12-25 MED ORDER — TERBINAFINE HCL 250 MG PO TABS: ORAL_TABLET | ORAL | 0 refills | Status: AC

## 2021-12-27 NOTE — Progress Notes (Signed)
Subjective:   Patient ID: Carrie [name], female   DOB: 76 y.o.   MRN: [id number]   HPI Patient presents stating she has had a dry flaky feet bilaterally for a long time and also has had severe nail disease that get very thick 1-5 both feet that are very hard to cut and can become painful with shoe gear.  Patient does not smoke likes to be active if possible   Review of Systems  All other systems reviewed and are negative.       Objective:  Physical Exam Vitals and nursing note reviewed.  Constitutional:      Appearance: She is well-developed.  Pulmonary:     Effort: Pulmonary effort is normal.  Musculoskeletal:        General: Normal range of motion.  Skin:    General: Skin is warm.  Neurological:     Mental Status: She is alert.     Neurovascular status intact muscle strength was found to be adequate range of motion adequate with patient found to have severely dystrophic thickened nailbeds 1-5 both feet that can become painful and have multiple signs of fungal infiltration.  Patient has dry skin bilateral that can become irritated and itchy at times.  Good digital perfusion well-oriented     Assessment:  Chronic painful mycotic nail infection 1-5 both feet and skin that is somewhat dry and may have fungal infiltration     [name]:  H&P reviewed the conditions and discussed both of them separately.  Working to try to put the patient on a pulse Lamisil oral therapy 1 pill/day for 7 days and I did explain the risk of this and then I went ahead and I debrided nailbeds 1-5 both feet no angiogenic bleeding and patient can use topical over-the-counter antibiotic fungal cream.  Reappoint as needed with all questions answered today

## 2022-01-02 NOTE — Telephone Encounter (Signed)
error 

## 2022-02-19 DIAGNOSIS — B351 Tinea unguium: Secondary | ICD-10-CM | POA: Diagnosis not present

## 2022-02-19 DIAGNOSIS — L0390 Cellulitis, unspecified: Secondary | ICD-10-CM | POA: Diagnosis not present

## 2022-02-19 DIAGNOSIS — Z79899 Other long term (current) drug therapy: Secondary | ICD-10-CM | POA: Diagnosis not present

## 2022-02-19 DIAGNOSIS — I10 Essential (primary) hypertension: Secondary | ICD-10-CM | POA: Diagnosis not present

## 2022-03-04 DIAGNOSIS — L0390 Cellulitis, unspecified: Secondary | ICD-10-CM | POA: Diagnosis not present

## 2022-03-04 DIAGNOSIS — I10 Essential (primary) hypertension: Secondary | ICD-10-CM | POA: Diagnosis not present

## 2022-03-24 DIAGNOSIS — Z1231 Encounter for screening mammogram for malignant neoplasm of breast: Secondary | ICD-10-CM | POA: Diagnosis not present

## 2022-04-20 DIAGNOSIS — I10 Essential (primary) hypertension: Secondary | ICD-10-CM | POA: Diagnosis not present

## 2022-04-20 DIAGNOSIS — E1159 Type 2 diabetes mellitus with other circulatory complications: Secondary | ICD-10-CM | POA: Diagnosis not present

## 2022-04-20 DIAGNOSIS — Z8673 Personal history of transient ischemic attack (TIA), and cerebral infarction without residual deficits: Secondary | ICD-10-CM | POA: Diagnosis not present

## 2022-04-20 DIAGNOSIS — E7800 Pure hypercholesterolemia, unspecified: Secondary | ICD-10-CM | POA: Diagnosis not present

## 2022-04-20 DIAGNOSIS — E789 Disorder of lipoprotein metabolism, unspecified: Secondary | ICD-10-CM | POA: Diagnosis not present

## 2022-04-27 DIAGNOSIS — Z8673 Personal history of transient ischemic attack (TIA), and cerebral infarction without residual deficits: Secondary | ICD-10-CM | POA: Diagnosis not present

## 2022-04-27 DIAGNOSIS — E7800 Pure hypercholesterolemia, unspecified: Secondary | ICD-10-CM | POA: Diagnosis not present

## 2022-04-27 DIAGNOSIS — M791 Myalgia, unspecified site: Secondary | ICD-10-CM | POA: Diagnosis not present

## 2022-04-27 DIAGNOSIS — E1159 Type 2 diabetes mellitus with other circulatory complications: Secondary | ICD-10-CM | POA: Diagnosis not present

## 2022-04-27 DIAGNOSIS — Z0000 Encounter for general adult medical examination without abnormal findings: Secondary | ICD-10-CM | POA: Diagnosis not present

## 2022-04-27 DIAGNOSIS — K219 Gastro-esophageal reflux disease without esophagitis: Secondary | ICD-10-CM | POA: Diagnosis not present

## 2022-08-02 ENCOUNTER — Ambulatory Visit: Payer: Medicare Other | Admitting: Podiatry

## 2022-08-02 ENCOUNTER — Encounter: Payer: Self-pay | Admitting: Podiatry

## 2022-08-02 DIAGNOSIS — M79675 Pain in left toe(s): Secondary | ICD-10-CM | POA: Diagnosis not present

## 2022-08-02 DIAGNOSIS — M79674 Pain in right toe(s): Secondary | ICD-10-CM | POA: Diagnosis not present

## 2022-08-02 DIAGNOSIS — E1142 Type 2 diabetes mellitus with diabetic polyneuropathy: Secondary | ICD-10-CM | POA: Diagnosis not present

## 2022-08-02 DIAGNOSIS — B351 Tinea unguium: Secondary | ICD-10-CM | POA: Diagnosis not present

## 2022-08-02 DIAGNOSIS — L84 Corns and callosities: Secondary | ICD-10-CM

## 2022-08-02 DIAGNOSIS — E119 Type 2 diabetes mellitus without complications: Secondary | ICD-10-CM | POA: Diagnosis not present

## 2022-08-02 NOTE — Progress Notes (Signed)
  Subjective:  Patient ID: Carrie [name], female    DOB: 01/12/1946,   MRN: [id number]  Chief Complaint  Patient presents with   Nail Problem    Bilateral fungus     77 y.o. female presents for concern of thickened elongated and painful nails that are difficult to trim. Requesting to have them trimmed today. Relates burning and tingling in their feet. Patient is diabetic and last A1c was  Lab Results  Component Value Date   HGBA1C 7.4 (H) 01/13/2019   . Concenr of callus as well on the outside of the foot  PCP:  Irena Reichmann, DO     . Denies any other pedal complaints. Denies n/v/f/c.   Past Medical History:  Diagnosis Date   Anemia    Diabetes mellitus    DM (diabetes mellitus) (HCC) 10/19/2018   HLD (hyperlipidemia) 10/19/2018   HTN (hypertension) 10/19/2018   Hypertension    Palpitations 10/19/2018   Stroke (HCC)     Objective:  Physical Exam: Vascular: DP/PT pulses 2/4 bilateral. CFT <3 seconds. Absent hair growth on digits. Edema noted to bilateral lower extremities. Xerosis noted bilaterally.  Skin. No lacerations or abrasions bilateral feet. Nails 1-5 bilateral  are thickened discolored and elongated with subungual debris. Hyperkeratotic lesion noted ot lateral base of fifth metatarsal on right Musculoskeletal: MMT 5/5 bilateral lower extremities in DF, PF, Inversion and Eversion. Deceased ROM in DF of ankle joint.  Neurological: Sensation intact to light touch. Protective sensation intact bilateral.    Assessment:   1. Pain due to onychomycosis of toenails of both feet   2. Diabetes mellitus type II, non insulin dependent (HCC)   3. Diabetic polyneuropathy associated with type 2 diabetes mellitus (HCC)   4. Callus      Plan:  Patient was evaluated and treated and all questions answered. -Discussed and educated patient on diabetic foot care, especially with  regards to the vascular, neurological and musculoskeletal systems.  -Stressed the importance of good  glycemic control and the detriment of not  controlling glucose levels in relation to the foot. -Discussed supportive shoes at all times and checking feet regularly.  -Mechanically debrided all nails 1-5 bilateral using sterile nail nipper and filed with dremel without incident  -Hyperkeratotic tissue debrided without incident with chisel.  -DM shoe ordered.  -Answered all patient questions -Patient to return  in 3 months for at risk foot care -Patient advised to call the office if any problems or questions arise in the meantime.   Louann Sjogren, DPM

## 2022-08-24 DIAGNOSIS — K219 Gastro-esophageal reflux disease without esophagitis: Secondary | ICD-10-CM | POA: Diagnosis not present

## 2022-08-24 DIAGNOSIS — I10 Essential (primary) hypertension: Secondary | ICD-10-CM | POA: Diagnosis not present

## 2022-08-24 DIAGNOSIS — E1159 Type 2 diabetes mellitus with other circulatory complications: Secondary | ICD-10-CM | POA: Diagnosis not present

## 2022-09-01 DIAGNOSIS — Z8673 Personal history of transient ischemic attack (TIA), and cerebral infarction without residual deficits: Secondary | ICD-10-CM | POA: Diagnosis not present

## 2022-09-01 DIAGNOSIS — K219 Gastro-esophageal reflux disease without esophagitis: Secondary | ICD-10-CM | POA: Diagnosis not present

## 2022-09-01 DIAGNOSIS — E1159 Type 2 diabetes mellitus with other circulatory complications: Secondary | ICD-10-CM | POA: Diagnosis not present

## 2022-09-01 DIAGNOSIS — I10 Essential (primary) hypertension: Secondary | ICD-10-CM | POA: Diagnosis not present

## 2022-09-01 DIAGNOSIS — R946 Abnormal results of thyroid function studies: Secondary | ICD-10-CM | POA: Diagnosis not present

## 2022-09-01 DIAGNOSIS — E7800 Pure hypercholesterolemia, unspecified: Secondary | ICD-10-CM | POA: Diagnosis not present

## 2022-09-01 DIAGNOSIS — G720 Drug-induced myopathy: Secondary | ICD-10-CM | POA: Diagnosis not present

## 2022-09-24 ENCOUNTER — Ambulatory Visit (INDEPENDENT_AMBULATORY_CARE_PROVIDER_SITE_OTHER): Payer: Medicare Other | Admitting: Podiatry

## 2022-09-24 DIAGNOSIS — L84 Corns and callosities: Secondary | ICD-10-CM

## 2022-09-24 DIAGNOSIS — E1142 Type 2 diabetes mellitus with diabetic polyneuropathy: Secondary | ICD-10-CM

## 2022-09-24 NOTE — Progress Notes (Signed)
Patient presents today to be measured for  diabetic shoes and insoles.  Patient was casted for  1 pair of diabetic shoes and 3 pairs of foam casted diabetic insoles.  Ht 5'1 Wt 129 Shoe size 6.32m Shoe type [id number]   Treating physician dana collins  Financial form signed    Re-appointment for regularly scheduled diabetic foot care visits or if they should experience any trouble with the shoes or insoles.

## 2022-11-01 ENCOUNTER — Ambulatory Visit: Payer: Medicare Other | Admitting: Podiatry

## 2022-11-16 ENCOUNTER — Ambulatory Visit (INDEPENDENT_AMBULATORY_CARE_PROVIDER_SITE_OTHER): Payer: Medicare Other | Admitting: Podiatry

## 2022-11-16 DIAGNOSIS — Z91199 Patient's noncompliance with other medical treatment and regimen due to unspecified reason: Secondary | ICD-10-CM

## 2022-11-16 NOTE — Progress Notes (Signed)
No show

## 2022-12-07 NOTE — Progress Notes (Signed)
Shoes and inserts are in patient was No show with Dr. Ralene Cork on 9.3 called and LM again to please reschedule

## 2022-12-10 ENCOUNTER — Telehealth: Payer: Self-pay | Admitting: Podiatry

## 2022-12-10 NOTE — Telephone Encounter (Signed)
Called the pt lmom to call the office to get scheduled to pick up shoes.

## 2022-12-24 ENCOUNTER — Ambulatory Visit (INDEPENDENT_AMBULATORY_CARE_PROVIDER_SITE_OTHER): Payer: Medicare Other

## 2022-12-24 DIAGNOSIS — E1142 Type 2 diabetes mellitus with diabetic polyneuropathy: Secondary | ICD-10-CM

## 2022-12-24 DIAGNOSIS — L309 Dermatitis, unspecified: Secondary | ICD-10-CM

## 2022-12-24 DIAGNOSIS — B351 Tinea unguium: Secondary | ICD-10-CM

## 2022-12-24 DIAGNOSIS — L84 Corns and callosities: Secondary | ICD-10-CM

## 2022-12-24 NOTE — Progress Notes (Signed)

## 2022-12-28 DIAGNOSIS — Z8673 Personal history of transient ischemic attack (TIA), and cerebral infarction without residual deficits: Secondary | ICD-10-CM | POA: Diagnosis not present

## 2022-12-28 DIAGNOSIS — E7800 Pure hypercholesterolemia, unspecified: Secondary | ICD-10-CM | POA: Diagnosis not present

## 2022-12-28 DIAGNOSIS — K219 Gastro-esophageal reflux disease without esophagitis: Secondary | ICD-10-CM | POA: Diagnosis not present

## 2022-12-28 DIAGNOSIS — I10 Essential (primary) hypertension: Secondary | ICD-10-CM | POA: Diagnosis not present

## 2022-12-28 DIAGNOSIS — E1159 Type 2 diabetes mellitus with other circulatory complications: Secondary | ICD-10-CM | POA: Diagnosis not present

## 2023-01-04 DIAGNOSIS — Z8673 Personal history of transient ischemic attack (TIA), and cerebral infarction without residual deficits: Secondary | ICD-10-CM | POA: Diagnosis not present

## 2023-01-04 DIAGNOSIS — I10 Essential (primary) hypertension: Secondary | ICD-10-CM | POA: Diagnosis not present

## 2023-01-04 DIAGNOSIS — E1159 Type 2 diabetes mellitus with other circulatory complications: Secondary | ICD-10-CM | POA: Diagnosis not present

## 2023-01-04 DIAGNOSIS — K219 Gastro-esophageal reflux disease without esophagitis: Secondary | ICD-10-CM | POA: Diagnosis not present

## 2023-01-04 DIAGNOSIS — E7800 Pure hypercholesterolemia, unspecified: Secondary | ICD-10-CM | POA: Diagnosis not present

## 2023-01-11 DIAGNOSIS — Z8673 Personal history of transient ischemic attack (TIA), and cerebral infarction without residual deficits: Secondary | ICD-10-CM | POA: Diagnosis not present

## 2023-01-11 DIAGNOSIS — I10 Essential (primary) hypertension: Secondary | ICD-10-CM | POA: Diagnosis not present

## 2023-01-11 DIAGNOSIS — E789 Disorder of lipoprotein metabolism, unspecified: Secondary | ICD-10-CM | POA: Diagnosis not present

## 2023-01-11 DIAGNOSIS — D509 Iron deficiency anemia, unspecified: Secondary | ICD-10-CM | POA: Diagnosis not present

## 2023-01-11 DIAGNOSIS — Z23 Encounter for immunization: Secondary | ICD-10-CM | POA: Diagnosis not present

## 2023-01-11 DIAGNOSIS — E1159 Type 2 diabetes mellitus with other circulatory complications: Secondary | ICD-10-CM | POA: Diagnosis not present

## 2023-01-11 DIAGNOSIS — K219 Gastro-esophageal reflux disease without esophagitis: Secondary | ICD-10-CM | POA: Diagnosis not present

## 2023-01-12 ENCOUNTER — Ambulatory Visit: Payer: Medicare Other | Admitting: Podiatry

## 2023-01-12 ENCOUNTER — Encounter: Payer: Self-pay | Admitting: Podiatry

## 2023-01-12 VITALS — Ht 61.0 in | Wt 138.0 lb

## 2023-01-12 DIAGNOSIS — B351 Tinea unguium: Secondary | ICD-10-CM

## 2023-01-12 DIAGNOSIS — E1142 Type 2 diabetes mellitus with diabetic polyneuropathy: Secondary | ICD-10-CM

## 2023-01-12 DIAGNOSIS — L84 Corns and callosities: Secondary | ICD-10-CM

## 2023-01-12 DIAGNOSIS — M79675 Pain in left toe(s): Secondary | ICD-10-CM | POA: Diagnosis not present

## 2023-01-12 DIAGNOSIS — M79674 Pain in right toe(s): Secondary | ICD-10-CM

## 2023-01-12 NOTE — Progress Notes (Signed)
  Subjective:  Patient ID: OLUWATOBI SCHINDLER, female    DOB: [birth year],   MRN: [id number]  No chief complaint on file.   77 y.o. female presents for concern of thickened elongated and painful nails that are difficult to trim. Requesting to have them trimmed today. Relates burning and tingling in their feet. Patient is diabetic and last A1c was  Lab Results  Component Value Date   HGBA1C 7.4 (H) 01/13/2019   . Concenr of callus as well on the outside of the foot  PCP:  Irena Reichmann, DO     . Denies any other pedal complaints. Denies n/v/f/c.   Past Medical History:  Diagnosis Date   Anemia    Diabetes mellitus    DM (diabetes mellitus) (HCC) 10/19/2018   HLD (hyperlipidemia) 10/19/2018   HTN (hypertension) 10/19/2018   Hypertension    Palpitations 10/19/2018   Stroke (HCC)     Objective:  Physical Exam: Vascular: DP/PT pulses 2/4 bilateral. CFT <3 seconds. Absent hair growth on digits. Edema noted to bilateral lower extremities. Xerosis noted bilaterally.  Skin. No lacerations or abrasions bilateral feet. Nails 1-5 bilateral  are thickened discolored and elongated with subungual debris. Hyperkeratotic lesion noted ot lateral base of fifth metatarsal on right Musculoskeletal: MMT 5/5 bilateral lower extremities in DF, PF, Inversion and Eversion. Deceased ROM in DF of ankle joint.  Neurological: Sensation intact to light touch. Protective sensation intact bilateral.    Assessment:   1. Pain due to onychomycosis of toenails of both feet   2. Callus   3. Diabetic polyneuropathy associated with type 2 diabetes mellitus (HCC)       Plan:  Patient was evaluated and treated and all questions answered. -Discussed and educated patient on diabetic foot care, especially with  regards to the vascular, neurological and musculoskeletal systems.  -Stressed the importance of good glycemic control and the detriment of not  controlling glucose levels in relation to the foot. -Discussed  supportive shoes at all times and checking feet regularly.  -Mechanically debrided all nails 1-5 bilateral using sterile nail nipper and filed with dremel without incident  -Hyperkeratotic tissue debrided without incident with chisel.   -Answered all patient questions -Patient to return  in 3 months for at risk foot care -Patient advised to call the office if any problems or questions arise in the meantime.   Louann Sjogren, DPM

## 2023-02-23 ENCOUNTER — Ambulatory Visit: Payer: Medicare Other | Admitting: Cardiology

## 2023-03-30 DIAGNOSIS — Z1231 Encounter for screening mammogram for malignant neoplasm of breast: Secondary | ICD-10-CM | POA: Diagnosis not present

## 2023-04-13 ENCOUNTER — Ambulatory Visit (INDEPENDENT_AMBULATORY_CARE_PROVIDER_SITE_OTHER): Payer: Medicare Other | Admitting: Podiatry

## 2023-04-13 DIAGNOSIS — Z91199 Patient's noncompliance with other medical treatment and regimen due to unspecified reason: Secondary | ICD-10-CM

## 2023-04-13 NOTE — Progress Notes (Signed)
No show

## 2023-05-03 DIAGNOSIS — E789 Disorder of lipoprotein metabolism, unspecified: Secondary | ICD-10-CM | POA: Diagnosis not present

## 2023-05-03 DIAGNOSIS — E1159 Type 2 diabetes mellitus with other circulatory complications: Secondary | ICD-10-CM | POA: Diagnosis not present

## 2023-05-03 DIAGNOSIS — D509 Iron deficiency anemia, unspecified: Secondary | ICD-10-CM | POA: Diagnosis not present

## 2023-05-03 DIAGNOSIS — Z8673 Personal history of transient ischemic attack (TIA), and cerebral infarction without residual deficits: Secondary | ICD-10-CM | POA: Diagnosis not present

## 2023-05-03 DIAGNOSIS — I10 Essential (primary) hypertension: Secondary | ICD-10-CM | POA: Diagnosis not present

## 2023-05-12 DIAGNOSIS — G720 Drug-induced myopathy: Secondary | ICD-10-CM | POA: Diagnosis not present

## 2023-05-12 DIAGNOSIS — Z0000 Encounter for general adult medical examination without abnormal findings: Secondary | ICD-10-CM | POA: Diagnosis not present

## 2023-05-12 DIAGNOSIS — I10 Essential (primary) hypertension: Secondary | ICD-10-CM | POA: Diagnosis not present

## 2023-05-12 DIAGNOSIS — Z8673 Personal history of transient ischemic attack (TIA), and cerebral infarction without residual deficits: Secondary | ICD-10-CM | POA: Diagnosis not present

## 2023-05-12 DIAGNOSIS — E1159 Type 2 diabetes mellitus with other circulatory complications: Secondary | ICD-10-CM | POA: Diagnosis not present

## 2023-05-12 DIAGNOSIS — R946 Abnormal results of thyroid function studies: Secondary | ICD-10-CM | POA: Diagnosis not present

## 2023-05-12 DIAGNOSIS — E7800 Pure hypercholesterolemia, unspecified: Secondary | ICD-10-CM | POA: Diagnosis not present

## 2023-05-12 DIAGNOSIS — K219 Gastro-esophageal reflux disease without esophagitis: Secondary | ICD-10-CM | POA: Diagnosis not present

## 2023-08-02 DIAGNOSIS — R946 Abnormal results of thyroid function studies: Secondary | ICD-10-CM | POA: Diagnosis not present

## 2023-08-02 DIAGNOSIS — E7800 Pure hypercholesterolemia, unspecified: Secondary | ICD-10-CM | POA: Diagnosis not present

## 2023-08-02 DIAGNOSIS — E1159 Type 2 diabetes mellitus with other circulatory complications: Secondary | ICD-10-CM | POA: Diagnosis not present

## 2023-08-09 DIAGNOSIS — E1159 Type 2 diabetes mellitus with other circulatory complications: Secondary | ICD-10-CM | POA: Diagnosis not present

## 2023-08-09 DIAGNOSIS — G720 Drug-induced myopathy: Secondary | ICD-10-CM | POA: Diagnosis not present

## 2023-08-09 DIAGNOSIS — I10 Essential (primary) hypertension: Secondary | ICD-10-CM | POA: Diagnosis not present

## 2023-08-09 DIAGNOSIS — E7800 Pure hypercholesterolemia, unspecified: Secondary | ICD-10-CM | POA: Diagnosis not present

## 2023-08-09 DIAGNOSIS — K219 Gastro-esophageal reflux disease without esophagitis: Secondary | ICD-10-CM | POA: Diagnosis not present

## 2023-08-09 DIAGNOSIS — Z79899 Other long term (current) drug therapy: Secondary | ICD-10-CM | POA: Diagnosis not present

## 2023-08-09 DIAGNOSIS — Z8673 Personal history of transient ischemic attack (TIA), and cerebral infarction without residual deficits: Secondary | ICD-10-CM | POA: Diagnosis not present

## 2023-11-02 DIAGNOSIS — Z79899 Other long term (current) drug therapy: Secondary | ICD-10-CM | POA: Diagnosis not present

## 2023-11-02 DIAGNOSIS — E1159 Type 2 diabetes mellitus with other circulatory complications: Secondary | ICD-10-CM | POA: Diagnosis not present

## 2023-11-02 DIAGNOSIS — E7800 Pure hypercholesterolemia, unspecified: Secondary | ICD-10-CM | POA: Diagnosis not present

## 2023-11-02 DIAGNOSIS — I10 Essential (primary) hypertension: Secondary | ICD-10-CM | POA: Diagnosis not present

## 2023-11-09 DIAGNOSIS — Z8673 Personal history of transient ischemic attack (TIA), and cerebral infarction without residual deficits: Secondary | ICD-10-CM | POA: Diagnosis not present

## 2023-11-09 DIAGNOSIS — E1159 Type 2 diabetes mellitus with other circulatory complications: Secondary | ICD-10-CM | POA: Diagnosis not present

## 2023-11-09 DIAGNOSIS — Z79899 Other long term (current) drug therapy: Secondary | ICD-10-CM | POA: Diagnosis not present

## 2023-11-09 DIAGNOSIS — E559 Vitamin D deficiency, unspecified: Secondary | ICD-10-CM | POA: Diagnosis not present

## 2023-11-09 DIAGNOSIS — E7800 Pure hypercholesterolemia, unspecified: Secondary | ICD-10-CM | POA: Diagnosis not present

## 2023-11-09 DIAGNOSIS — I10 Essential (primary) hypertension: Secondary | ICD-10-CM | POA: Diagnosis not present

## 2023-12-13 DIAGNOSIS — I10 Essential (primary) hypertension: Secondary | ICD-10-CM | POA: Diagnosis not present

## 2023-12-13 DIAGNOSIS — E1159 Type 2 diabetes mellitus with other circulatory complications: Secondary | ICD-10-CM | POA: Diagnosis not present

## 2024-02-24 ENCOUNTER — Other Ambulatory Visit (HOSPITAL_COMMUNITY): Payer: Self-pay | Admitting: Family Medicine

## 2024-02-24 ENCOUNTER — Ambulatory Visit (HOSPITAL_COMMUNITY)
Admission: RE | Admit: 2024-02-24 | Discharge: 2024-02-24 | Disposition: A | Source: Ambulatory Visit | Attending: Surgery | Admitting: Surgery

## 2024-02-24 DIAGNOSIS — M7989 Other specified soft tissue disorders: Secondary | ICD-10-CM

## 2024-02-24 DIAGNOSIS — I739 Peripheral vascular disease, unspecified: Secondary | ICD-10-CM

## 2024-02-24 DIAGNOSIS — L539 Erythematous condition, unspecified: Secondary | ICD-10-CM | POA: Diagnosis not present

## 2024-02-24 LAB — VAS US ABI WITH/WO TBI
Left ABI: 0.58
Right ABI: 0.41

## 2024-02-29 ENCOUNTER — Encounter: Payer: Self-pay | Admitting: Vascular Surgery

## 2024-02-29 ENCOUNTER — Ambulatory Visit: Attending: Vascular Surgery | Admitting: Vascular Surgery

## 2024-02-29 VITALS — BP 135/77 | HR 67 | Temp 97.9°F | Ht 61.0 in | Wt 130.4 lb

## 2024-02-29 DIAGNOSIS — I739 Peripheral vascular disease, unspecified: Secondary | ICD-10-CM | POA: Diagnosis not present

## 2024-02-29 NOTE — Progress Notes (Signed)
 Patient ID: Carrie [name], female   DOB: 1945-10-25, 78 y.o.   MRN: [id number]  Reason for Consult: No chief complaint on file.   Referred by Gerome Brunet, DO  Subjective:     HPI:  Carrie [name] is a 78 y.o. female without vascular history but does have history of stroke, diabetes, hyperlipidemia and hypertension.  She is a lifelong non-smoker.  She has history of intolerance to statins currently on Plavix  but no aspirin  or blood thinners.  In October she wore compression stockings and had splitting of her bilateral great toenails.  She does not have claudication.  She does not have rest pain or tissue loss at this time although she does have neuropathy in both of her feet she walks without limitation.    Past Medical History:  Diagnosis Date   Allergic rhinitis    Anemia    Diabetes mellitus    DM (diabetes mellitus) (HCC) 10/19/2018   GERD (gastroesophageal reflux disease)    History of cerebrovascular accident with residual deficit    HLD (hyperlipidemia) 10/19/2018   HTN (hypertension) 10/19/2018   Hypercholesterolemia    Iron deficiency anemia    Palpitations 10/19/2018   Peripheral circulatory disorder associated with type 2 diabetes mellitus (HCC)    Stroke (HCC)    Family History  Problem Relation Age of Onset   Hypertension Father    Diabetes Mother    Hypertension Mother    Diabetes Brother    Diabetes Sister    Past Surgical History:  Procedure Laterality Date   ABDOMINAL HYSTERECTOMY     1993   CESAREAN SECTION      Short Social History:  Social History   Tobacco Use   Smoking status: Never   Smokeless tobacco: Never  Substance Use Topics   Alcohol use: No    Allergies[1]  Current Outpatient Medications  Medication Sig Dispense Refill   amLODipine (NORVASC) 5 MG tablet Take 5 mg by mouth daily.     cetirizine (ZYRTEC) 10 MG tablet Take 10 mg by mouth at bedtime as needed for allergies.      cloNIDine  (CATAPRES ) 0.2 MG tablet Take 1  tablet (0.2 mg total) by mouth 2 (two) times daily. 74 tablet 0   clopidogrel  (PLAVIX ) 75 MG tablet Take 1 tablet by mouth once daily for 90     diazepam (VALIUM) 5 MG tablet Take 5 mg by mouth every 6 (six) hours as needed for anxiety.      empagliflozin (JARDIANCE) 25 MG TABS tablet Take 25 mg by mouth daily.     esomeprazole (NEXIUM) 40 MG capsule Take 40 mg by mouth daily as needed (for acid reflux).      fluticasone (FLONASE) 50 MCG/ACT nasal spray Place 1 spray into both nostrils daily.     glimepiride (AMARYL) 4 MG tablet Take 4 mg by mouth daily with breakfast.     hydrochlorothiazide (HYDRODIURIL) 25 MG tablet Take 25 mg by mouth daily.     Iron-FA-B Cmp-C-Biot-Probiotic (FUSION PLUS) CAPS Take 1 capsule by mouth daily.     metoprolol  (TOPROL -XL) 100 MG 24 hr tablet Take 100 mg by mouth daily.       sitaGLIPtin-metformin (JANUMET) 50-1000 MG tablet Take 1 tablet by mouth 2 (two) times daily with a meal.     spironolactone (ALDACTONE) 25 MG tablet TAKE 1 TABLET BY MOUTH ONCE DAILY IN THE MORNING 90 tablet 3   telmisartan (MICARDIS) 40 MG tablet Take 20 mg by mouth daily.  terbinafine  (LAMISIL ) 250 MG tablet Please take one a day x 7days, repeat every 4 weeks x 4 months 28 tablet 0   No current facility-administered medications for this visit.    Review of Systems  Constitutional:  Constitutional negative. HENT: HENT negative.  Eyes: Eyes negative.  Respiratory: Respiratory negative.  Cardiovascular: Cardiovascular negative. Negative for claudication.  GI: Gastrointestinal negative.  Musculoskeletal: Musculoskeletal negative. Negative for joint pain.  Skin: Skin negative.  Neurological: Positive for numbness.  Hematologic: Hematologic/lymphatic negative.  Psychiatric: Psychiatric negative.        Objective:  Objective   Vitals:   02/29/24 1215  BP: 135/77  Pulse: 67  Temp: 97.9 F (36.6 C)  SpO2: 98%     Physical Exam HENT:     Head: Normocephalic.      Nose: Nose normal.  Eyes:     Pupils: Pupils are equal, round, and reactive to light.  Cardiovascular:     Rate and Rhythm: Normal rate.     Pulses:          Femoral pulses are 2+ on the right side and 1+ on the left side.      Popliteal pulses are 0 on the right side and 1+ on the left side.       Dorsalis pedis pulses are 0 on the right side and 0 on the left side.       Posterior tibial pulses are 0 on the right side and 0 on the left side.  Pulmonary:     Effort: Pulmonary effort is normal.  Abdominal:     General: Abdomen is flat.  Musculoskeletal:        General: Normal range of motion.     Cervical back: Normal range of motion.     Right lower leg: No edema.     Left lower leg: No edema.  Skin:    General: Skin is warm.     Capillary Refill: Capillary refill takes 2 to 3 seconds.  Neurological:     General: No focal deficit present.     Mental Status: She is alert.  Psychiatric:        Mood and Affect: Mood normal.        Data: +-----------+--------+-----+--------+----------------+-----------------+  RIGHT     PSV cm/sRatioStenosisWaveform        Comments           +-----------+--------+-----+--------+----------------+-----------------+  CFA Prox   56                   barely triphasicdampened, blunted  +-----------+--------+-----+--------+----------------+-----------------+  DFA       59                   triphasic       dampened, blunted  +-----------+--------+-----+--------+----------------+-----------------+  SFA Prox   0            occluded                                   +-----------+--------+-----+--------+----------------+-----------------+  SFA Mid    0            occluded                                   +-----------+--------+-----+--------+----------------+-----------------+  SFA Distal 0  occluded                                    +-----------+--------+-----+--------+----------------+-----------------+  POP Prox   0            occluded                                   +-----------+--------+-----+--------+----------------+-----------------+  POP Mid    26                   monophasic                         +-----------+--------+-----+--------+----------------+-----------------+  POP Distal 24                   monophasic                         +-----------+--------+-----+--------+----------------+-----------------+  TP Trunk   42                   monophasic                         +-----------+--------+-----+--------+----------------+-----------------+  ATA Distal 0            occluded                                   +-----------+--------+-----+--------+----------------+-----------------+  PTA Distal 0            occluded                                   +-----------+--------+-----+--------+----------------+-----------------+  PERO Prox  0            occluded                                   +-----------+--------+-----+--------+----------------+-----------------+  PERO Mid   20                   monophasic                         +-----------+--------+-----+--------+----------------+-----------------+  PERO Distal36                   monophasic                         +-----------+--------+-----+--------+----------------+-----------------+        +-----------+--------+-----+--------+----------+-------------------+  LEFT      PSV cm/sRatioStenosisWaveform  Comments             +-----------+--------+-----+--------+----------+-------------------+  CFA Prox   70                   biphasic                       +-----------+--------+-----+--------+----------+-------------------+  DFA       123                  biphasic                       +-----------+--------+-----+--------+----------+-------------------+  SFA Prox   83                    biphasic                       +-----------+--------+-----+--------+----------+-------------------+  SFA Mid    87                   biphasic                       +-----------+--------+-----+--------+----------+-------------------+  SFA Distal 49                   biphasic                       +-----------+--------+-----+--------+----------+-------------------+  POP Prox   73                   biphasic                       +-----------+--------+-----+--------+----------+-------------------+  POP Mid    82                   biphasic                       +-----------+--------+-----+--------+----------+-------------------+  POP Distal 66                   biphasic                       +-----------+--------+-----+--------+----------+-------------------+  TP Trunk   43                   biphasic                       +-----------+--------+-----+--------+----------+-------------------+  ATA Prox   0            occluded                               +-----------+--------+-----+--------+----------+-------------------+  ATA Mid    0            occluded                               +-----------+--------+-----+--------+----------+-------------------+  ATA Distal 59                   monophasic                     +-----------+--------+-----+--------+----------+-------------------+  PTA Distal 0            occluded                               +-----------+--------+-----+--------+----------+-------------------+  PERO Distal                               not well visualized  +-----------+--------+-----+--------+----------+-------------------+        Summary:  Right: Atherosclerosis throughout.  Total occlusion of the proximal to distal SFA.  Total occlusion of the above-knee popliteal artery.  Zero vessel run-off.   Left: Atherosclerosis throughout  without evidence of stenosis in the   common femoral artery to the tibioperoneal confluence.  Tibial vessel disease. Possible zero vessel run-off; peroneal artery not  well visualized.    ABI Findings: +---------+------------------+-----+-------------------+--------+ Right    Rt Pressure (mmHg)IndexWaveform           Comment  +---------+------------------+-----+-------------------+--------+ Brachial 204                                                +---------+------------------+-----+-------------------+--------+ PTA      57                0.28 dampened monophasic         +---------+------------------+-----+-------------------+--------+ DP       83                0.41 dampened monophasic         +---------+------------------+-----+-------------------+--------+ Great Toe31                0.15 Normal                      +---------+------------------+-----+-------------------+--------+  +---------+------------------+-----+----------+-------+ Left     Lt Pressure (mmHg)IndexWaveform  Comment +---------+------------------+-----+----------+-------+ Brachial 197                                      +---------+------------------+-----+----------+-------+ PTA      118               0.58 monophasic        +---------+------------------+-----+----------+-------+ DP       106               0.52 monophasic        +---------+------------------+-----+----------+-------+ Great Toe88                0.43 Abnormal          +---------+------------------+-----+----------+-------+  +-------+-----------+-----------+------------+------------+ ABI/TBIToday's ABIToday's TBIPrevious ABIPrevious TBI +-------+-----------+-----------+------------+------------+ Right  .41        .15                                 +-------+-----------+-----------+------------+------------+ Left   .58        .43                                  +-------+-----------+-----------+------------+------------+    Summary: Right: Resting right ankle-brachial index indicates severe right lower extremity arterial disease. The right toe-brachial index is abnormal.   Left: Resting left ankle-brachial index indicates moderate left lower extremity arterial disease. The left toe-brachial index is abnormal. Left toe pressure is >60 mmHg which suggests adequate perfusion for healing.       Assessment/Plan:    78 year old female with severely depressed ABI on the right and moderate on the left with multilevel disease on the right however she appears mostly asymptomatic other than some recently split toenails which are healing.  As such I have recommended protecting her feet diligently.  Her right foot has the appearance of dependent rubor but given that she is asymptomatic we will continue to watch and I will have her follow-up in 3 months with repeat right lower  extremity arterial duplex and ABIs.  I have asked her to add aspirin  to her Plavix  regimen and I will have her evaluated by pharmacy for consideration of lipid management with history of intolerance to statins.  All questions were answered and she demonstrates good understanding.  If she does develop right foot pain or ulceration in the interim 3 months she can be scheduled for angiography from a left common femoral approach without further evaluation.     Penne Lonni [place] MD [facility]      [1]  Allergies Allergen Reactions   Lipitor [email or web address] ] Hives    Patient reported that she broke out from in a rash    Penicillins Hives   Ezetimibe     Other reaction(s): muscle symptoms, Other Muscle Symptoms    Pioglitazone     Other reaction(s): edema, Other Edema    Amlodipine     Other reaction(s): itching   Semaglutide     Other reaction(s): nausea and throat irritation

## 2024-03-01 ENCOUNTER — Other Ambulatory Visit: Payer: Self-pay | Admitting: *Deleted

## 2024-03-01 DIAGNOSIS — I739 Peripheral vascular disease, unspecified: Secondary | ICD-10-CM

## 2024-03-28 ENCOUNTER — Ambulatory Visit: Admitting: Student-PharmD

## 2024-04-10 ENCOUNTER — Ambulatory Visit: Admitting: Podiatry

## 2024-04-18 ENCOUNTER — Ambulatory Visit: Admitting: Podiatry

## 2024-04-18 ENCOUNTER — Encounter: Payer: Self-pay | Admitting: Podiatry

## 2024-04-18 DIAGNOSIS — L97512 Non-pressure chronic ulcer of other part of right foot with fat layer exposed: Secondary | ICD-10-CM

## 2024-04-18 DIAGNOSIS — E08621 Diabetes mellitus due to underlying condition with foot ulcer: Secondary | ICD-10-CM

## 2024-04-18 NOTE — Progress Notes (Signed)
 "  Subjective:  Patient ID: Carrie [name], female    DOB: 08/09/1945,   MRN: [id number]  Chief Complaint  Patient presents with   Diabetes    My feet, one is worse than the other.  It started out as soreness around the cuticle of my toe.  The compression stocking created all this problem.  The irritated my toes and made them sore.  Next thing I knew, my foot started swelling up on me.  Saw Dr. Lonell Collet - 03/21/2024; A1c - 8.2    79 y.o. female presents for concern of foot pain bilateral mostly in the right foot great toe area.  She rates it started out as soreness around the cuticle.  She relates she was wearing compression stockings and this created the problem.  Her foot has been swelling recently.  She was seen by Dr. Sheree with a vascular back in December.  At that time no major symptoms were present so no plans for angiogram unless anything had changed.  She is diabetic and her last A1c was 8.2 denies any other pedal complaints. Denies n/v/f/c.   Past Medical History:  Diagnosis Date   Allergic rhinitis    Anemia    Diabetes mellitus    DM (diabetes mellitus) (HCC) 10/19/2018   GERD (gastroesophageal reflux disease)    History of cerebrovascular accident with residual deficit    HLD (hyperlipidemia) 10/19/2018   HTN (hypertension) 10/19/2018   Hypercholesterolemia    Iron deficiency anemia    Palpitations 10/19/2018   Peripheral circulatory disorder associated with type 2 diabetes mellitus (HCC)    Stroke (HCC)     Objective:  Physical Exam: Vascular: DP/PT pulses 0/4 bilateral. CFT <3 seconds. Feet cold to touch. Absent hair growth on digits. Edema noted to bilateral lower extremities. Xerosis noted bilaterally.  Dependent rubor present Skin. No lacerations or abrasions bilateral feet. Nails 1-5 bilateral  are thickened discolored and elongated with subungual debris.  Upon debridement right hallux nail loosened from nail bed and underlying nailbed exposed.  No erythema  edema or purulence noted.  Nailbed has not healed and open ulceration present. Musculoskeletal: MMT 5/5 bilateral lower extremities in DF, PF, Inversion and Eversion. Deceased ROM in DF of ankle joint.  Neurological: Sensation intact to light touch. Protective sensation diminished bilateral.   Summary:  Right: Resting right ankle-brachial index indicates severe right lower  extremity arterial disease. The right toe-brachial index is abnormal.    Left: Resting left ankle-brachial index indicates moderate left lower  extremity arterial disease. The left toe-brachial index is abnormal.  Left toe pressure is >60 mmHg which suggests adequate perfusion for  healing.   *See table(s) above for measurements and observations.  See LE Arterial duplex report.    Assessment:   1. Diabetic ulcer of toe of right foot associated with diabetes mellitus due to underlying condition, with fat layer exposed (HCC)      Plan:  Patient was evaluated and treated and all questions answered. Ulcer right hallux nailbed with fat layer exposed -Debridement of nail plate with underlying ulceration present no further debridement as concern for circulation.  Area was washed and irrigated. -Dressed with gentamicin, DSD. -Discussed concern for circulation given new ulceration at this point and pain in the foot.  Advised to follow-up with Dr. Sheree for accelerated plan of angiogram -No abx indicated.  -Discussed glucose control and proper protein-rich diet.  -Discussed if any worsening redness, pain, fever or chills to call or may need to  report to the emergency room. Patient expressed understanding.  Return in 2 weeks for recheck       Asberry Failing, DPM    "

## 2024-05-02 ENCOUNTER — Ambulatory Visit: Admitting: Podiatry

## 2024-05-23 ENCOUNTER — Ambulatory Visit (HOSPITAL_COMMUNITY)

## 2024-05-23 ENCOUNTER — Ambulatory Visit
